# Patient Record
Sex: Female | Born: 1970
Health system: Southern US, Community
[De-identification: ages and names within clinical notes are randomized; demographics above are authoritative.]

## PROBLEM LIST (undated history)

## (undated) DIAGNOSIS — T7840XA Allergy, unspecified, initial encounter: Secondary | ICD-10-CM

## (undated) DIAGNOSIS — K921 Melena: Secondary | ICD-10-CM

## (undated) HISTORY — DX: Allergy, unspecified, initial encounter: T78.40XA

## (undated) HISTORY — DX: Melena: K92.1

---

## 1999-01-17 ENCOUNTER — Other Ambulatory Visit: Admission: RE | Admit: 1999-01-17 | Discharge: 1999-01-17 | Payer: Self-pay | Admitting: Gynecology

## 2000-01-29 ENCOUNTER — Other Ambulatory Visit: Admission: RE | Admit: 2000-01-29 | Discharge: 2000-01-29 | Payer: Self-pay | Admitting: Gynecology

## 2001-01-29 ENCOUNTER — Other Ambulatory Visit: Admission: RE | Admit: 2001-01-29 | Discharge: 2001-01-29 | Payer: Self-pay | Admitting: Obstetrics and Gynecology

## 2002-02-07 ENCOUNTER — Other Ambulatory Visit: Admission: RE | Admit: 2002-02-07 | Discharge: 2002-02-07 | Payer: Self-pay | Admitting: Obstetrics and Gynecology

## 2003-02-20 ENCOUNTER — Other Ambulatory Visit: Admission: RE | Admit: 2003-02-20 | Discharge: 2003-02-20 | Payer: Self-pay | Admitting: Obstetrics and Gynecology

## 2003-06-15 ENCOUNTER — Emergency Department (HOSPITAL_COMMUNITY): Admission: EM | Admit: 2003-06-15 | Discharge: 2003-06-15 | Payer: Self-pay | Admitting: Emergency Medicine

## 2004-01-21 ENCOUNTER — Inpatient Hospital Stay (HOSPITAL_COMMUNITY): Admission: AD | Admit: 2004-01-21 | Discharge: 2004-01-23 | Payer: Self-pay | Admitting: Obstetrics and Gynecology

## 2004-03-04 ENCOUNTER — Other Ambulatory Visit: Admission: RE | Admit: 2004-03-04 | Discharge: 2004-03-04 | Payer: Self-pay | Admitting: Obstetrics and Gynecology

## 2005-03-10 ENCOUNTER — Other Ambulatory Visit: Admission: RE | Admit: 2005-03-10 | Discharge: 2005-03-10 | Payer: Self-pay | Admitting: Obstetrics and Gynecology

## 2005-04-03 ENCOUNTER — Emergency Department (HOSPITAL_COMMUNITY): Admission: EM | Admit: 2005-04-03 | Discharge: 2005-04-04 | Payer: Self-pay | Admitting: Emergency Medicine

## 2005-04-07 ENCOUNTER — Ambulatory Visit: Payer: Self-pay | Admitting: Internal Medicine

## 2005-09-29 ENCOUNTER — Encounter (INDEPENDENT_AMBULATORY_CARE_PROVIDER_SITE_OTHER): Payer: Self-pay | Admitting: *Deleted

## 2005-09-29 ENCOUNTER — Ambulatory Visit (HOSPITAL_COMMUNITY): Admission: RE | Admit: 2005-09-29 | Discharge: 2005-09-29 | Payer: Self-pay | Admitting: Obstetrics and Gynecology

## 2006-01-20 ENCOUNTER — Ambulatory Visit: Payer: Self-pay | Admitting: Internal Medicine

## 2006-02-06 ENCOUNTER — Ambulatory Visit: Payer: Self-pay | Admitting: Internal Medicine

## 2006-10-04 ENCOUNTER — Emergency Department (HOSPITAL_COMMUNITY): Admission: EM | Admit: 2006-10-04 | Discharge: 2006-10-04 | Payer: Self-pay | Admitting: Emergency Medicine

## 2006-10-07 ENCOUNTER — Ambulatory Visit: Payer: Self-pay | Admitting: Internal Medicine

## 2006-11-02 ENCOUNTER — Ambulatory Visit: Payer: Self-pay | Admitting: Internal Medicine

## 2006-11-02 ENCOUNTER — Inpatient Hospital Stay (HOSPITAL_COMMUNITY): Admission: AD | Admit: 2006-11-02 | Discharge: 2006-11-04 | Payer: Self-pay | Admitting: Obstetrics and Gynecology

## 2007-07-13 ENCOUNTER — Encounter: Payer: Self-pay | Admitting: Internal Medicine

## 2008-06-27 ENCOUNTER — Ambulatory Visit: Payer: Self-pay | Admitting: Internal Medicine

## 2008-06-27 DIAGNOSIS — J209 Acute bronchitis, unspecified: Secondary | ICD-10-CM

## 2008-06-28 DIAGNOSIS — F411 Generalized anxiety disorder: Secondary | ICD-10-CM | POA: Insufficient documentation

## 2008-10-11 ENCOUNTER — Emergency Department (HOSPITAL_COMMUNITY): Admission: EM | Admit: 2008-10-11 | Discharge: 2008-10-11 | Payer: Self-pay | Admitting: Family Medicine

## 2008-10-13 ENCOUNTER — Telehealth: Payer: Self-pay | Admitting: Internal Medicine

## 2008-10-13 ENCOUNTER — Encounter: Payer: Self-pay | Admitting: Internal Medicine

## 2008-10-13 ENCOUNTER — Ambulatory Visit: Payer: Self-pay | Admitting: Internal Medicine

## 2008-10-13 DIAGNOSIS — J189 Pneumonia, unspecified organism: Secondary | ICD-10-CM

## 2008-10-13 DIAGNOSIS — R930 Abnormal findings on diagnostic imaging of skull and head, not elsewhere classified: Secondary | ICD-10-CM | POA: Insufficient documentation

## 2008-10-13 LAB — CONVERTED CEMR LAB
BUN: 18 mg/dL (ref 6–23)
Basophils Absolute: 0.1 10*3/uL (ref 0.0–0.1)
Basophils Relative: 0.8 % (ref 0.0–3.0)
CO2: 30 meq/L (ref 19–32)
Calcium: 9.5 mg/dL (ref 8.4–10.5)
Chloride: 106 meq/L (ref 96–112)
Creatinine, Ser: 0.7 mg/dL (ref 0.4–1.2)
Eosinophils Absolute: 0.1 10*3/uL (ref 0.0–0.7)
Eosinophils Relative: 1 % (ref 0.0–5.0)
GFR calc Af Amer: 121 mL/min
GFR calc non Af Amer: 100 mL/min
Glucose, Bld: 88 mg/dL (ref 70–99)
HCT: 38.4 % (ref 36.0–46.0)
Hemoglobin: 13 g/dL (ref 12.0–15.0)
Lymphocytes Relative: 17.7 % (ref 12.0–46.0)
MCHC: 33.9 g/dL (ref 30.0–36.0)
MCV: 92.1 fL (ref 78.0–100.0)
Monocytes Absolute: 1.1 10*3/uL — ABNORMAL HIGH (ref 0.1–1.0)
Monocytes Relative: 14.9 % — ABNORMAL HIGH (ref 3.0–12.0)
Neutro Abs: 4.6 10*3/uL (ref 1.4–7.7)
Neutrophils Relative %: 65.6 % (ref 43.0–77.0)
Platelets: 200 10*3/uL (ref 150–400)
Potassium: 4 meq/L (ref 3.5–5.1)
RBC: 4.17 M/uL (ref 3.87–5.11)
RDW: 12.5 % (ref 11.5–14.6)
Sodium: 142 meq/L (ref 135–145)
WBC: 7.2 10*3/uL (ref 4.5–10.5)
hCG, Beta Chain, Quant, S: <0.5 milliintl units/mL

## 2008-10-16 ENCOUNTER — Telehealth: Payer: Self-pay | Admitting: Internal Medicine

## 2009-03-22 ENCOUNTER — Ambulatory Visit: Payer: Self-pay | Admitting: Internal Medicine

## 2009-11-12 ENCOUNTER — Ambulatory Visit: Payer: Self-pay | Admitting: Internal Medicine

## 2009-11-12 DIAGNOSIS — H1030 Unspecified acute conjunctivitis, unspecified eye: Secondary | ICD-10-CM | POA: Insufficient documentation

## 2010-05-11 ENCOUNTER — Emergency Department (HOSPITAL_COMMUNITY): Admission: EM | Admit: 2010-05-11 | Discharge: 2010-05-11 | Payer: Self-pay | Admitting: Family Medicine

## 2010-08-23 ENCOUNTER — Ambulatory Visit (HOSPITAL_COMMUNITY): Admission: RE | Admit: 2010-08-23 | Discharge: 2010-08-23 | Payer: Self-pay | Admitting: Orthopaedic Surgery

## 2010-09-17 ENCOUNTER — Encounter: Payer: Self-pay | Admitting: Internal Medicine

## 2010-10-29 ENCOUNTER — Ambulatory Visit: Payer: Self-pay | Admitting: Internal Medicine

## 2010-10-29 DIAGNOSIS — D72829 Elevated white blood cell count, unspecified: Secondary | ICD-10-CM | POA: Insufficient documentation

## 2010-10-29 LAB — CONVERTED CEMR LAB
Basophils Absolute: 0 10*3/uL (ref 0.0–0.1)
Basophils Relative: 0.4 % (ref 0.0–3.0)
Eosinophils Absolute: 0.1 10*3/uL (ref 0.0–0.7)
Eosinophils Relative: 0.6 % (ref 0.0–5.0)
HCT: 38.7 % (ref 36.0–46.0)
Hemoglobin: 13.3 g/dL (ref 12.0–15.0)
Lymphocytes Relative: 29.5 % (ref 12.0–46.0)
Lymphs Abs: 3 10*3/uL (ref 0.7–4.0)
MCHC: 34.2 g/dL (ref 30.0–36.0)
MCV: 92.1 fL (ref 78.0–100.0)
Monocytes Absolute: 0.7 10*3/uL (ref 0.1–1.0)
Monocytes Relative: 7 % (ref 3.0–12.0)
Neutro Abs: 6.4 10*3/uL (ref 1.4–7.7)
Neutrophils Relative %: 62.5 % (ref 43.0–77.0)
Platelets: 240 10*3/uL (ref 150.0–400.0)
RBC: 4.21 M/uL (ref 3.87–5.11)
RDW: 12.8 % (ref 11.5–14.6)
WBC: 10.3 10*3/uL (ref 4.5–10.5)

## 2010-12-08 ENCOUNTER — Encounter: Payer: Self-pay | Admitting: Internal Medicine

## 2010-12-19 NOTE — Letter (Signed)
Summary: [facility]   Imported By: Lennie Odor 11/01/2010 11:20:46  _____________________________________________________________________  External Attachment:    Type:   Image     Comment:   External Document

## 2010-12-19 NOTE — Assessment & Plan Note (Signed)
Summary: follow up-abnormal labs-lb   Vital Signs:  Patient profile:   40 year old female Height:      63 inches Weight:      102.50 pounds BMI:     18.22 O2 Sat:      98 % on Room air Temp:     98.3 degrees F oral Pulse rate:   96 / minute BP sitting:   100 / 62  (left arm) Cuff size:   regular  Vitals Entered By: Zella Ball Ewing CMA Duncan Dull) (October 29, 2010 4:12 PM)  O2 Flow:  Room air CC: Followup on adnormal labs/RE   CC:  Followup on adnormal labs/RE.  History of Present Illness: here to f/u recent labs per [facility] yearly exam , with elev WBC wtih incr lymph and granulocytes approx Sep 17 2010; pt relates at the time she has significant cough mild prod with fever, general weakness and malaise that she thought was viral bronchitis and did not seek attention;  has hx of prob bact PNA 2009 .  Symtpoms did last for over a wk and seemed to resolve without further complication;  no further headache, malaise or weakness, sinus symtpoms or ST, cough, and no CP, worsening sob, doe, wheezing, orthopnea, pnd, worsening LE edema, palps, dizziness or syncope .  Pt denies new neuro symptoms such as headache, facial or extremity weakness Pt denies polydipsia, polyuria,  Also states she had recent normal labs per GYN with routine well women's exam approx 2 mo ago as well.  Became concerrned when she read online that elev WBC can mean pre-cancer.   Preventive Screening-Counseling & Management      Drug Use:  no.    Problems Prior to Update: 1)  Leukocytosis  (ICD-288.60) 2)  Conjunctivitis, Acute  (ICD-372.00) 3)  Chest Xray, Abnormal  (ICD-793.1) 4)  Pneumonia, Organism Unspecified  (ICD-486) 5)  Anxiety  (ICD-300.00) 6)  Asthmatic Bronchitis, Acute  (ICD-466.0)  Medications Prior to Update: 1)  Tobramycin Sulfate 0.3 % Soln (Tobramycin Sulfate) .... 2 Gtts Four Times Per Day For 10 Days To Affected Eye 2)  Cephalexin 500 Mg Caps (Cephalexin) .Marland Kitchen.. 1 By Mouth Three Times A  Day  Current Medications (verified): 1)  None  Allergies (verified): No Known Drug Allergies  Past History:  Past Medical History: Last updated: 06/27/2008 Anxiety  Past Surgical History: Last updated: 06/27/2008 hx of D&C  Social History: Last updated: 10/29/2010 Former Smoker work - patient accounting - [employer] Married 2 children Alcohol use-yes - social Husband has had a vasectomy Drug use-no  Risk Factors: Alcohol Use: <1 (10/13/2008) Exercise: yes (10/13/2008)  Risk Factors: Smoking Status: quit (06/27/2008) Passive Smoke Exposure: no (10/13/2008)  Social History: Former Smoker work - [employer] Married 2 children Alcohol use-yes - social Husband has had a vasectomy Drug use-no  Review of Systems       all otherwise negative per pt -    Physical Exam  General:  alert and well-developed.  but small framed Head:  normocephalic and atraumatic.   Eyes:  vision grossly intact, pupils equal, and pupils round.   Ears:  R ear normal and L ear normal.   Nose:  no external deformity and no nasal discharge.   Mouth:  no gingival abnormalities and pharynx pink and moist.   Neck:  supple and no masses.   Lungs:  normal respiratory effort and normal breath sounds.   Heart:  normal rate and regular rhythm.  Extremities:  no edema, no erythema    Impression & Recommendations:  Problem # 1:  LEUKOCYTOSIS (ICD-288.60) most likely related to recent transent ? bacterial illness, exam benign currently, to check repeat cbc , but pt reassured very low likelihood of pre-cancer Orders: TLB-CBC Platelet - w/Differential (85025-CBCD)  Patient Instructions: 1)  Please go to the Lab in the basement for your blood and/or urine tests today  2)  Please call the number on the [facility] Card for results of your testing  3)  Please schedule a follow-up appointment as needed.   Orders Added: 1)  TLB-CBC Platelet - w/Differential [85025-CBCD] 2)   Est. Patient Level III [16109]     Preventive Care Screening  Last Tetanus Booster:    Date:  11/17/2006    Results:  Tdap

## 2011-04-04 NOTE — Discharge Summary (Signed)
Kara [name], Kara [name]               ACCOUNT NO.:  [id number]   MEDICAL RECORD NO.:  [id number]          PATIENT TYPE:  INP   LOCATION:  9117                          FACILITY:  [facility]   PHYSICIAN:  Gerrit Friends. Aldona Bar, M.D.   DATE OF BIRTH:  [birth year]   DATE OF ADMISSION:  11/02/2006  DATE OF DISCHARGE:  11/04/2006                               DISCHARGE SUMMARY   DISCHARGE DIAGNOSES:  1. Term pregnancy delivered 8 pound 4 ounce female infant, Apgars 9 and      9.  2. Blood type A+.   PROCEDURES:  1. Normal spontaneous delivery.  2. Second-degree tear and repair.   SUMMARY:  This 40 year old gravida 3, para 1 presented at term in labor  with ruptured membranes.  She received an epidural on request and had a  fairly rapid progression with a subsequent normal spontaneous delivery  of a viable 8 pound 3 ounce female infant with Apgars of 9 and 9, over a  second-degree tear which was repaired without difficulty.   Her postpartum course was benign.  She had been begun on Zithromax to  treat an upper respiratory infection/otitis media which was continued  postpartum.  Her discharge hemoglobin was 12.8 with a white count of  14,200 and a platelet count of 180,000.  On the morning of November 04, 2006, she was ambulating well, tolerating a regular diet well, bottle  feeding was going without difficulty.  She was comfortable and desirous  of discharge.  Accordingly, she was given a discharge brochure and all  instructions were explained and understood.   DISCHARGE MEDICATIONS:  1. Zithromax which will be continued.  2. She will finish her prenatal vitamins - one a day.  3. She was given a prescription for Motrin 600 mg to use every 6 hours      as needed for cramping and pain.  4. Tylox to use one to two every 4-6 hours as needed for more severe      pain.   She will return to the office follow-up in approximately four weeks'  time or as needed.   CONDITION ON DISCHARGE:  Improved.      Gerrit Friends. Aldona Bar, M.D.  Electronically Signed     RMW/MEDQ  D:  11/04/2006  T:  11/04/2006  Job:  [id number]

## 2011-04-04 NOTE — Op Note (Signed)
[name], Kara [name]               ACCOUNT NO.:  [id number]   MEDICAL RECORD NO.:  [id number]          PATIENT TYPE:  AMB   LOCATION:  SDC                           FACILITY:  [facility]   PHYSICIAN:  Carrington Clamp, M.D. DATE OF BIRTH:  [birth year]   DATE OF PROCEDURE:  09/29/2005  DATE OF DISCHARGE:                                 OPERATIVE REPORT   PREOPERATIVE DIAGNOSIS:  Missed abortion.   POSTOPERATIVE DIAGNOSIS:  Incomplete abortion.   PROCEDURE:  Suction dilation and curettage.   ATTENDING:  Carrington Clamp, M.D.   ANESTHESIA:  General.   SPECIMENS:  Uterine contents.   ESTIMATED BLOOD LOSS:  Minimal.   IV FLUIDS:  1000.   URINE OUTPUT:  Not measured.   COMPLICATIONS:  None.   FINDINGS:  A seven-week size uterus down to six weeks with good cry post  procedure.   MEDICATIONS:  Ancef.   COUNTS:  Correct x3.   TECHNIQUE:  After adequate general LMA anesthesia was achieved, the patient  was prepped and draped in the usual sterile fashion in the dorsal lithotomy  position.  After the bladder was emptied with a red rubber catheter, the  speculum was placed in the vagina, a single-tooth tenaculum was placed on  the cervix and the cervix dilated with Shawnie Pons dilators.  The patient had been  bleeding fairly heavily this morning and the cervix actually was already  significantly dilated enough to consider it an incomplete AB.  In that case,  the patient received a dose of Ancef 1 g to prevent infection.  A 9 mm  curette was passed easily into the uterus and suction curettage was  performed.  Once bleeding was minimal and no more tissue was obtained, sharp  curettage indicated that there was good cry and all products had been  removed.  All instruments were then removed from the uterus and the cervix.  The single-tooth tenaculum site had to be held pressure with a ring forceps  for about 30 seconds to stop bleeding, and then all  instruments were withdrawn from the vagina.   The patient tolerated the  procedure well, was returned to recovery in stable condition.   ADDENDUM:  The patient's blood type is A positive.      Carrington Clamp, M.D.  Electronically Signed     MH/MEDQ  D:  09/29/2005  T:  09/29/2005  Job:  [id number]

## 2011-04-04 NOTE — Assessment & Plan Note (Signed)
[facility]                                 ON-CALL NOTE   NAME:[name], Kara HUFFAKER                      MRN:          [id number]  DATE:03/27/2009                            DOB:          1971/05/30    PHONE NUMBER:  [phone].   Dr. Jonny Ruiz is her regular doctor and Dr. Milinda Antis on call.   The patient complained of foot pain.  She said she saw Dr. Jonny Ruiz on  Thursday for an upper respiratory tract infection, was put on Avelox for  5 days.  She is feeling much better with that.  Since middle of the  weekend, her foot is painful.  She said that it is not red or swollen.  She could walk on it but it is just bothersome now.  It feels somewhat  numb and tingly although she can move all the toes and has no weakness.  She denies any injury and again denies redness or swelling of the foot  and denies fever.  She said she is concerned about the possible side  effects from Avelox.  Her last dose was yesterday, so she is no longer  on it.  I told her to elevate her foot tonight.  She can use some gentle  heat or cold compresses that makes it feel better.  If her symptoms  worsen, then go to the emergency room or call back if she develops  fever, swelling, or other symptoms and otherwise to call the office at  8:30 in the morning to get an appointment to be seen.     Marne A. Tower, MD  Electronically Signed    MAT/MedQ  DD: 03/28/2009  DT: 03/29/2009  Job #: [id number]

## 2011-08-19 LAB — POCT URINALYSIS DIP (DEVICE)
Glucose, UA: NEGATIVE mg/dL
Ketones, ur: 80 mg/dL — AB
Nitrite: NEGATIVE
Protein, ur: 30 mg/dL — AB
Specific Gravity, Urine: 1.025 (ref 1.005–1.030)
Urobilinogen, UA: 0.2 mg/dL (ref 0.0–1.0)

## 2011-08-19 LAB — CBC
MCHC: 34.2 g/dL (ref 30.0–36.0)
MCV: 90.5 fL (ref 78.0–100.0)
Platelets: 199 10*3/uL (ref 150–400)
WBC: 11.2 10*3/uL — ABNORMAL HIGH (ref 4.0–10.5)

## 2011-08-19 LAB — POCT PREGNANCY, URINE: Preg Test, Ur: NEGATIVE

## 2013-10-10 LAB — HM MAMMOGRAPHY

## 2013-10-11 ENCOUNTER — Other Ambulatory Visit: Payer: Self-pay | Admitting: Obstetrics and Gynecology

## 2014-06-08 ENCOUNTER — Telehealth: Payer: Self-pay | Admitting: Internal Medicine

## 2014-06-08 NOTE — Telephone Encounter (Signed)
Called pt back she stated she was dx with bronchitis they had rx zpack for her 7 cough syrup. Advise pt to continue taking meds as rx Dr. Jonny RuizJohn first avail want be until 06/22/14. Made appt @ 10:45 on that day...Raechel Chute/lmb

## 2014-06-08 NOTE — Telephone Encounter (Signed)
Patient was at urgent care yesterday.   She was told to follow up with her primary care.  It looks like she had not seen Dr. Jonny RuizJohn in some years.  Patient is requesting to reestablish with Dr. Jonny RuizJohn so is she can get in for this appointment.

## 2014-06-08 NOTE — Telephone Encounter (Signed)
I [facility]  Best I can say is OK to make OV next available

## 2014-06-22 ENCOUNTER — Ambulatory Visit (INDEPENDENT_AMBULATORY_CARE_PROVIDER_SITE_OTHER): Payer: 59 | Admitting: Internal Medicine

## 2014-06-22 ENCOUNTER — Telehealth: Payer: Self-pay

## 2014-06-22 ENCOUNTER — Other Ambulatory Visit (INDEPENDENT_AMBULATORY_CARE_PROVIDER_SITE_OTHER): Payer: 59

## 2014-06-22 ENCOUNTER — Ambulatory Visit (INDEPENDENT_AMBULATORY_CARE_PROVIDER_SITE_OTHER)
Admission: RE | Admit: 2014-06-22 | Discharge: 2014-06-22 | Disposition: A | Discharge status: Home / Self Care | Payer: 59 | Source: Ambulatory Visit | Attending: Internal Medicine | Admitting: Internal Medicine

## 2014-06-22 ENCOUNTER — Encounter: Payer: Self-pay | Admitting: Internal Medicine

## 2014-06-22 VITALS — BP 94/60 | HR 98 | Temp 98.3°F | Ht 63.0 in | Wt 100.5 lb

## 2014-06-22 DIAGNOSIS — J189 Pneumonia, unspecified organism: Secondary | ICD-10-CM

## 2014-06-22 DIAGNOSIS — R911 Solitary pulmonary nodule: Secondary | ICD-10-CM

## 2014-06-22 DIAGNOSIS — Z0000 Encounter for general adult medical examination without abnormal findings: Secondary | ICD-10-CM

## 2014-06-22 LAB — CBC WITH DIFFERENTIAL/PLATELET
Basophils Absolute: 0 10*3/uL (ref 0.0–0.1)
Basophils Relative: 0.3 % (ref 0.0–3.0)
Eosinophils Absolute: 0 10*3/uL (ref 0.0–0.7)
Eosinophils Relative: 0.4 % (ref 0.0–5.0)
HCT: 41.5 % (ref 36.0–46.0)
Hemoglobin: 13.7 g/dL (ref 12.0–15.0)
Lymphocytes Relative: 19.5 % (ref 12.0–46.0)
Lymphs Abs: 2 10*3/uL (ref 0.7–4.0)
MCHC: 33 g/dL (ref 30.0–36.0)
MCV: 90.8 fl (ref 78.0–100.0)
Monocytes Absolute: 0.8 10*3/uL (ref 0.1–1.0)
Monocytes Relative: 7.7 % (ref 3.0–12.0)
Neutro Abs: 7.4 10*3/uL (ref 1.4–7.7)
Neutrophils Relative %: 72.1 % (ref 43.0–77.0)
Platelets: 359 10*3/uL (ref 150.0–400.0)
RBC: 4.57 Mil/uL (ref 3.87–5.11)
RDW: 14.1 % (ref 11.5–15.5)
WBC: 10.2 10*3/uL (ref 4.0–10.5)

## 2014-06-22 LAB — URINALYSIS, ROUTINE W REFLEX MICROSCOPIC
Bilirubin Urine: NEGATIVE
Ketones, ur: NEGATIVE
Leukocytes, UA: NEGATIVE
Nitrite: NEGATIVE
Specific Gravity, Urine: <=1.005 — AB (ref 1.000–1.030)
Total Protein, Urine: NEGATIVE
Urine Glucose: NEGATIVE
Urobilinogen, UA: 0.2 (ref 0.0–1.0)
WBC, UA: NONE SEEN (ref 0–?)
pH: 5.5 (ref 5.0–8.0)

## 2014-06-22 LAB — HEPATIC FUNCTION PANEL
ALT: 17 U/L (ref 0–35)
AST: 19 U/L (ref 0–37)
Albumin: 4.6 g/dL (ref 3.5–5.2)
Alkaline Phosphatase: 45 U/L (ref 39–117)
Bilirubin, Direct: 0.1 mg/dL (ref 0.0–0.3)
Total Bilirubin: 0.4 mg/dL (ref 0.2–1.2)
Total Protein: 7.8 g/dL (ref 6.0–8.3)

## 2014-06-22 LAB — BASIC METABOLIC PANEL
BUN: 15 mg/dL (ref 6–23)
CO2: 24 mEq/L (ref 19–32)
Calcium: 9.4 mg/dL (ref 8.4–10.5)
Chloride: 104 mEq/L (ref 96–112)
Creatinine, Ser: 0.8 mg/dL (ref 0.4–1.2)
GFR: 86.79 mL/min (ref >60.00)
Glucose, Bld: 93 mg/dL (ref 70–99)
Potassium: 3.8 mEq/L (ref 3.5–5.1)
Sodium: 137 mEq/L (ref 135–145)

## 2014-06-22 LAB — TSH: TSH: 1.54 u[IU]/mL (ref 0.35–4.50)

## 2014-06-22 LAB — LIPID PANEL
Cholesterol: 156 mg/dL (ref 0–200)
HDL: 54.6 mg/dL (ref >39.00)
LDL Cholesterol: 87 mg/dL (ref 0–99)
NonHDL: 101.4
Total CHOL/HDL Ratio: 3
Triglycerides: 73 mg/dL (ref 0.0–149.0)
VLDL: 14.6 mg/dL (ref 0.0–40.0)

## 2014-06-22 NOTE — Telephone Encounter (Signed)
Chest xray ordered

## 2014-06-22 NOTE — Progress Notes (Signed)
Subjective:    Patient ID: Kara [name], female    DOB: 08/22/1971, 43 y.o.   MRN: [id number]  HPI  Here for wellness and re-establish and f/u ? Lung nodule;  Overall doing ok;  Pt denies CP, worsening SOB, DOE, wheezing, orthopnea, PND, worsening LE edema, palpitations, dizziness or syncope.  Pt denies neurological change such as new headache, facial or extremity weakness.  Pt denies polydipsia, polyuria, or low sugar symptoms. Pt states overall good compliance with treatment and medications, good tolerability, and has been trying to follow lower cholesterol diet.  Pt denies worsening depressive symptoms, suicidal ideation or panic. No fever, night sweats, wt loss, loss of appetite, or other constitutional symptoms.  Pt states good ability with ADL's, has low fall risk, home safety reviewed and adequate, no other significant changes in hearing or vision  [facility]en at [facility] 2 wks ago, with fever, HA and small cough,tx for bronchitis  - zpack; cxr showed lung nodule left lung; has previous cxr 2010 neg.  CXR nov 25/209 with right mid lung nodule.  Prior hx smoking 1ppd for10 yrs, but none for 15 yrs.  Most recent cxr per her understanding showed nodule left lung, but then recalls provider mentioned it appeared no change from previous.  No record avail today Past Medical History  Diagnosis Date  . Allergy   . Blood in stool    No past surgical history on file.  reports that she has quit smoking. She does not have any smokeless tobacco history on file. She reports that she drinks alcohol. She reports that she does not use illicit drugs. family history includes Hypertension in her father and mother; Mental illness in her other. Allergies  Allergen Reactions  . Avelox [Moxifloxacin Hcl In Nacl]     Caused skin to tingle   No current outpatient prescriptions on file prior to visit.   No current facility-administered medications on file prior to visit.   Review of Systems Constitutional: Negative for  increased diaphoresis, other activity, appetite or other siginficant weight change  HENT: Negative for worsening hearing loss, ear pain, facial swelling, mouth sores and neck stiffness.   Eyes: Negative for other worsening pain, redness or visual disturbance.  Respiratory: Negative for shortness of breath and wheezing.   Cardiovascular: Negative for chest pain and palpitations.  Gastrointestinal: Negative for diarrhea, blood in stool, abdominal distention or other pain Genitourinary: Negative for hematuria, flank pain or change in urine volume.  Musculoskeletal: Negative for myalgias or other joint complaints.  Skin: Negative for color change and wound.  Neurological: Negative for syncope and numbness. other than noted Hematological: Negative for adenopathy. or other swelling Psychiatric/Behavioral: Negative for hallucinations, self-injury, decreased concentration or other worsening agitation.      Objective:   Physical Exam BP 94/60  Pulse 98  Temp(Src) 98.3 F (36.8 C) (Oral)  Ht 5\' 3"  (1.6 m)  Wt 100 lb 8 oz (45.587 kg)  BMI 17.81 kg/m2  SpO2 95% VS noted,  Constitutional: Pt is oriented to person, place, and time. Appears well-developed and well-nourished.  Head: Normocephalic and atraumatic.  Right Ear: External ear normal.  Left Ear: External ear normal.  Nose: Nose normal.  Mouth/Throat: Oropharynx is clear and moist.  Eyes: Conjunctivae and EOM are normal. Pupils are equal, round, and reactive to light.  Neck: Normal range of motion. Neck supple. No JVD present. No tracheal deviation present.  Cardiovascular: Normal rate, regular rhythm, normal heart sounds and intact distal pulses.   Pulmonary/Chest:  Effort normal and breath sounds without rales or wheezing  Abdominal: Soft. Bowel sounds are normal. NT. No HSM  Musculoskeletal: Normal range of motion. Exhibits no edema.  Lymphadenopathy:  Has no cervical adenopathy.  Neurological: Pt is alert and oriented to person,  place, and time. Pt has normal reflexes. No cranial nerve deficit. Motor grossly intact Skin: Skin is warm and dry. No rash noted.  Psychiatric:  Has normal mood and affect. Behavior is normal. except 2+ nervous    Assessment & Plan:

## 2014-06-22 NOTE — Assessment & Plan Note (Signed)
With some ? Of left vs right nodule, and hx of smoking, for cxr f/u today, ? Related to recent bronchitis vs other; pt declines CT for now, will ask for f/u cxr today

## 2014-06-22 NOTE — Patient Instructions (Signed)
Please continue all other medications as before, and refills have been done if requested.  Please have the pharmacy call with any other refills you may need.  Please continue your efforts at being more active, low cholesterol diet, and weight control.  You are otherwise up to date with prevention measures today.  Please keep your appointments with your specialists as you may have planned  Please go to the XRAY Department in the Basement (go straight as you get off the elevator) for the x-ray testing  Please go to the LAB in the Basement (turn left off the elevator) for the tests to be done today  You will be contacted by phone if any changes need to be made immediately.  Otherwise, you will receive a letter about your results with an explanation, but please check with MyChart first.  Please remember to sign up for MyChart if you have not done so, as this will be important to you in the future with finding out test results, communicating by private email, and scheduling acute appointments online when needed.  Please return in 1 year for your yearly visit, or sooner if needed 

## 2014-06-22 NOTE — Progress Notes (Signed)
Pre visit review using our clinic review tool, if applicable. No additional management support is needed unless otherwise documented below in the visit note. 

## 2014-06-24 ENCOUNTER — Encounter: Payer: Self-pay | Admitting: Internal Medicine

## 2014-06-24 NOTE — Assessment & Plan Note (Signed)

## 2014-06-28 ENCOUNTER — Ambulatory Visit (INDEPENDENT_AMBULATORY_CARE_PROVIDER_SITE_OTHER): Payer: 59 | Admitting: Internal Medicine

## 2014-06-28 ENCOUNTER — Ambulatory Visit (INDEPENDENT_AMBULATORY_CARE_PROVIDER_SITE_OTHER)
Admission: RE | Admit: 2014-06-28 | Discharge: 2014-06-28 | Disposition: A | Discharge status: Home / Self Care | Payer: 59 | Source: Ambulatory Visit | Attending: Internal Medicine | Admitting: Internal Medicine

## 2014-06-28 ENCOUNTER — Encounter: Payer: Self-pay | Admitting: Internal Medicine

## 2014-06-28 VITALS — BP 102/60 | HR 95 | Temp 98.1°F | Wt 101.0 lb

## 2014-06-28 DIAGNOSIS — J189 Pneumonia, unspecified organism: Secondary | ICD-10-CM

## 2014-06-28 DIAGNOSIS — R911 Solitary pulmonary nodule: Secondary | ICD-10-CM

## 2014-06-28 DIAGNOSIS — H9209 Otalgia, unspecified ear: Secondary | ICD-10-CM

## 2014-06-28 DIAGNOSIS — H9202 Otalgia, left ear: Secondary | ICD-10-CM

## 2014-06-28 NOTE — Progress Notes (Signed)
   Subjective:    Patient ID: Kara [name], female    DOB: 07/29/1971, 43 y.o.   MRN: [id number]  HPI  Here to f/u, c/o pain left ear yesterday transient but no hearing loss, vertigo, HA, d/c.  Pt denies further fever, cough, chest pain, increased sob or doe, wheezing, orthopnea, PND, increased LE swelling, palpitations, dizziness or syncope.  Pt denies fever, wt loss, night sweats, loss of appetite, or other constitutional symptoms. Pt also concerned about ? Of pulm nodule that might require CT.  Past Medical History  Diagnosis Date  . Allergy   . Blood in stool    No past surgical history on file.  reports that she has quit smoking. She does not have any smokeless tobacco history on file. She reports that she drinks alcohol. She reports that she does not use illicit drugs. family history includes Hypertension in her father and mother; Mental illness in her other. Allergies  Allergen Reactions  . Avelox [Moxifloxacin Hcl In Nacl]     Caused skin to tingle   No current outpatient prescriptions on file prior to visit.   No current facility-administered medications on file prior to visit.   Review of Systems  Constitutional: Negative for unusual diaphoresis or other sweats  HENT: Negative for ringing in ear Eyes: Negative for double vision or worsening visual disturbance.  Respiratory: Negative for choking and stridor.   Gastrointestinal: Negative for vomiting or other signifcant bowel change Genitourinary: Negative for hematuria or decreased urine volume.  Musculoskeletal: Negative for other MSK pain or swelling Skin: Negative for color change and worsening wound.  Neurological: Negative for tremors and numbness other than noted  Psychiatric/Behavioral: Negative for decreased concentration or agitation other than above       Objective:   Physical Exam BP 102/60  Pulse 95  Temp(Src) 98.1 F (36.7 C) (Oral)  Wt 101 lb (45.813 kg)  SpO2 96%  LMP 06/18/2014 VS noted,    Constitutional: Pt appears well-developed, well-nourished.  HENT: Head: NCAT.  Right Ear: External ear normal.  Left Ear: External ear normal.  Eyes: . Pupils are equal, round, and reactive to light. Conjunctivae and EOM are normal Neck: Normal range of motion. Neck supple.  Cardiovascular: Normal rate and regular rhythm.   Pulmonary/Chest: Effort normal and breath sounds normal.  Neurological: Pt is alert. Not confused , motor grossly intact Skin: Skin is warm. No rash Psychiatric: Pt behavior is normal. No agitation.     Assessment & Plan:

## 2014-06-28 NOTE — Progress Notes (Signed)
Pre visit review using our clinic review tool, if applicable. No additional management support is needed unless otherwise documented below in the visit note. 

## 2014-07-02 DIAGNOSIS — H9202 Otalgia, left ear: Secondary | ICD-10-CM | POA: Insufficient documentation

## 2014-07-02 NOTE — Assessment & Plan Note (Signed)
Exam benign, hold further specific eval and tx,  to f/u any worsening symptoms or concerns

## 2014-07-02 NOTE — Assessment & Plan Note (Signed)
Resolved clinically, for f/u cxr as above, hold further antibiotic for now

## 2014-07-02 NOTE — Patient Instructions (Signed)
Please continue all other medications as before, and refills have been done if requested.  Please have the pharmacy call with any other refills you may need.  Please continue your efforts at being more active, low cholesterol diet, and weight control.  Please keep your appointments with your specialists as you may have planned  Please go to the XRAY Department in the Basement (go straight as you get off the elevator) for the x-ray testing  You will be contacted by phone if any changes need to be made immediately.  Otherwise, you will receive a letter about your results with an explanation, but please check with MyChart first.  Please remember to sign up for MyChart if you have not done so, as this will be important to you in the future with finding out test results, communicating by private email, and scheduling acute appointments online when needed.  

## 2014-07-02 NOTE — Assessment & Plan Note (Signed)
For f/u cxr, suscept related to recent infectious process, for CT if persistent by cxr

## 2014-12-13 ENCOUNTER — Other Ambulatory Visit: Payer: Self-pay | Admitting: Obstetrics and Gynecology

## 2014-12-14 LAB — CYTOLOGY - PAP

## 2016-02-14 ENCOUNTER — Other Ambulatory Visit: Payer: Self-pay | Admitting: Obstetrics and Gynecology

## 2016-02-14 DIAGNOSIS — Z01419 Encounter for gynecological examination (general) (routine) without abnormal findings: Secondary | ICD-10-CM | POA: Diagnosis not present

## 2016-02-14 DIAGNOSIS — Z681 Body mass index (BMI) 19 or less, adult: Secondary | ICD-10-CM | POA: Diagnosis not present

## 2016-02-14 DIAGNOSIS — Z124 Encounter for screening for malignant neoplasm of cervix: Secondary | ICD-10-CM | POA: Diagnosis not present

## 2016-02-14 DIAGNOSIS — Z1231 Encounter for screening mammogram for malignant neoplasm of breast: Secondary | ICD-10-CM | POA: Diagnosis not present

## 2016-02-15 LAB — CYTOLOGY - PAP

## 2016-03-08 DIAGNOSIS — J020 Streptococcal pharyngitis: Secondary | ICD-10-CM | POA: Diagnosis not present

## 2016-03-08 DIAGNOSIS — J029 Acute pharyngitis, unspecified: Secondary | ICD-10-CM | POA: Diagnosis not present

## 2016-08-21 DIAGNOSIS — H5213 Myopia, bilateral: Secondary | ICD-10-CM | POA: Diagnosis not present

## 2016-08-28 DIAGNOSIS — L03032 Cellulitis of left toe: Secondary | ICD-10-CM | POA: Diagnosis not present

## 2016-09-24 ENCOUNTER — Encounter: Payer: Self-pay | Admitting: Podiatry

## 2016-09-24 ENCOUNTER — Ambulatory Visit (INDEPENDENT_AMBULATORY_CARE_PROVIDER_SITE_OTHER): Payer: 59 | Admitting: Podiatry

## 2016-09-24 DIAGNOSIS — L923 Foreign body granuloma of the skin and subcutaneous tissue: Secondary | ICD-10-CM

## 2016-09-24 DIAGNOSIS — L600 Ingrowing nail: Secondary | ICD-10-CM | POA: Diagnosis not present

## 2016-09-24 DIAGNOSIS — Z23 Encounter for immunization: Secondary | ICD-10-CM | POA: Diagnosis not present

## 2016-09-24 NOTE — Patient Instructions (Addendum)
   Today I removed any hair that was lodged at the bottom of the left big toenail. Complete any of your remaining antibiotics as prescribed Use over-the-counter pain medication as needed such as acetaminophen or ibuprofen  Dishwashing SOAP OR  Epsom SALT INSTRUCTIONS  THE DAY AFTER PROCEDURE  Please follow the instructions your doctor has marked.   Shower as usual. Before getting out, place a drop of dishwashing liquid soap (Dial) on a wet, clean washcloth.  Gently wipe washcloth over affected area.  Afterward, rinse the area with warm water.  Blot the area dry with a soft cloth and cover with antibiotic ointment (neosporin, polysporin, bacitracin) and band aid or gauze and tape Place 3-4 drops of dishwashing soap in a quart of warm tap water.  Submerge foot into water for 2 minutes.  If bandage was applied after your procedure, leave on to allow for easy lift off, then remove and continue with soak for the remaining time.  Next, blot area dry with a soft cloth and cover with a bandage.  Apply other medications as directed by your doctor, such as triple antibiotic ointment or neosporin antibiotic ointment.

## 2016-09-24 NOTE — Progress Notes (Signed)
   Subjective:    Patient ID: Kara [name], female    DOB: 08/02/1971, 45 y.o.   MRN: [id number]  HPI     This patient presents today complaining of approximately one month history of pain around the left great toenail area which she says has been diagnosed as an ingrowing toenail. She describes the discomfort in around the left hallux toenail as burning, throbbing, numbness, soreness with some drainage and has some nighttime pain. Patient says that she's been to a dermatologist who prescribed doxycycline which she's been taking twice a day for 1 month without a reduction of the symptoms. She went to another dermatologist in the past 1-2 days who referred the patient to our office     Review of Systems  All other systems reviewed and are negative.      Objective:   Physical Exam  Patient appears orientated 3  Vascular: No calf edema or calf tenderness bilaterally DP and PT pulses 2/4 bilaterally Capillary reflex immediate bilaterally  Neurological: Sensation to 10 g monofilament wire intact 5/5 bilaterally Vibratory sensation reactive bilaterally Ankle reflex equal and reactive bilaterally  Dermatological: The posterior lateral nail fold area appears to have a fine hair and is on the dorsal aspect of the nail plate with adjacent granulation tissue. There is a low-grade erythema in the distal hallux and around the nail area. There is some slights serous drainage from the posterior nail fold area without any malodor. The dorsal left hallux nails tender to direct palpation. The medial margin of the left hallux toenail is mildly incurvated and slightly tender  Musculoskeletal: Manual motor testing: Dorsi flexion, plantar flexion, inversion, eversion 5/5 bilaterally Hammertoes 2-3 bilaterally      Assessment & Plan:   Assessment: Paronychia left hallux Suspect foreign body/hair posterior nail fold left hallux  Plan: I informed patient that I saw visual evidence of a  possible foreign body (hair) and recommended that we remove the foreign body under local to see if this resolved the symptoms. Patient verbally consents  Left hallux is blocked with 3 mL 50-50 mixture of 2% plain Xylocaine and 0.5% plain Sensorcaine. Toe is prepped with Betadine and exsanguinated. The posterior nail fold was probed and a 5 mm hair was removed. There is no active drainage or pus production after removal of the hair. There is some granulation tissue in the posterior nail fold area. An antibiotic compression dressing was applied. The tourniquet was released and spontaneous capillary filling time noted in the left hallux. Patient tolerated procedure without any difficulty.  Postoperative soaks suggesting dishwashing soap or Epsom salts and topical application of antibiotic ointment starting in 24 hours. Complete any remaining doxycycline. Recommended over-the-counter pain  meds for pain control. Will have patient back in the next 7 days to see if the symptoms are resolving with removal of the foreign body/hair. Will consider removing the margin of the hallux nail or more the nail if the symptoms persist. Also will consider x-ray examination if the symptoms persist.  Reappoint 7 days

## 2016-10-01 ENCOUNTER — Ambulatory Visit (INDEPENDENT_AMBULATORY_CARE_PROVIDER_SITE_OTHER): Payer: 59 | Admitting: Podiatry

## 2016-10-01 ENCOUNTER — Encounter: Payer: Self-pay | Admitting: Podiatry

## 2016-10-01 VITALS — BP 108/75 | HR 91 | Resp 18

## 2016-10-01 DIAGNOSIS — L923 Foreign body granuloma of the skin and subcutaneous tissue: Secondary | ICD-10-CM

## 2016-10-01 NOTE — Patient Instructions (Signed)
The infection has resolved after removal of the hair lodged in the base of the left great toenail. No further treatment is indicated

## 2016-10-02 NOTE — Progress Notes (Signed)
Patient ID: Kara CooksMichelle L [name], female   DOB: 10/02/1971, 45 y.o.   MRN: [id number]   Subjective: This patient presents for follow-up from the visit of 09/24/2016. At that time a foreign body/hair was removed from the posterior nail fold area the left hallux. Patient states that her toe is comfortable now and she denies any pain in the area at this time. She was instructed to complete any remaining antibiotics that she had on the visit of 09/24/2016, however, patient admits that she really did not take any further antibiotics  Objective: Orientated 3 DP and PT pulses 2/4 bilaterally No peripheral edema or calf tenderness bilaterally The left hallux nail posterior nail demonstrates no erythema, edema, warmth, drainage The left hallux is not tender to direct pressure  Assessment: Resolved paronychia left hallux associated with foreign body  Plan: Informed patient that the infection has resolved and no further treatment is indicated. Patient is discharged with instruction to contact she has any future concerns

## 2016-11-23 DIAGNOSIS — J019 Acute sinusitis, unspecified: Secondary | ICD-10-CM | POA: Diagnosis not present

## 2016-12-11 ENCOUNTER — Ambulatory Visit (INDEPENDENT_AMBULATORY_CARE_PROVIDER_SITE_OTHER): Payer: 59 | Admitting: Podiatry

## 2016-12-11 VITALS — BP 108/75 | HR 83 | Resp 14

## 2016-12-11 DIAGNOSIS — M79675 Pain in left toe(s): Secondary | ICD-10-CM

## 2016-12-11 DIAGNOSIS — B351 Tinea unguium: Secondary | ICD-10-CM | POA: Diagnosis not present

## 2016-12-11 NOTE — Progress Notes (Signed)
This patient presents the office concerned about the big toenail on her left foot. She states that she had an infection. 3 months ago that was treated by Dr. Leeanne Deeduchman. He says that that area on the left big toe has healed and are causing any pain or discomfort. He says now that she has a painful area on the tip of the great toe left foot is occasionally painful walking and wearing her shoes. She denies any history of trauma or injury to the foot. No drainage is noted facility for an evaluation and treatment of this condition   GENERAL APPEARANCE: Alert, conversant. Appropriately groomed. No acute distress.  VASCULAR: Pedal pulses are  [facility].  Capillary refill time is immediate to all digits,  Normal temperature gradient.  Digital hair growth is present bilateral  NEUROLOGIC: sensation is normal to 5.07 monofilament at 5/5 sites bilateral.  Light touch is intact bilateral, Muscle strength normal.  MUSCULOSKELETAL: acceptable muscle strength, tone and stability bilateral.  Intrinsic muscluature intact bilateral.  Rectus appearance of foot and digits noted bilateral.   DERMATOLOGIC: skin color, texture, and turgor are within normal limits.  No preulcerative lesions or ulcers  are seen, no interdigital maceration noted.  No open lesions present.   No drainage noted.  Nails  Distal aspect left hallux is incurvated and brownish in color.  No swelling or drainage noted.  There is a red inflamed area at the distal nail border left great toe.    Onychomycosis  Vs. Possible bone spur.  ROV  told this patient that I do not believe there is any evidence of fungal elements to her nail. There is a red, inflamed area, which could be due to a bone spur off the distal aspect of the hallux. We need to watch this area to see if it enlarges over time. In the meantime, I did suggest that we will provide her with formula 3 for application to the nail to keep the nail from worsening. She is return  to the office when necessary  Helane GuntherGregory Ciani Rutten DPM

## 2017-01-04 DIAGNOSIS — W19XXXA Unspecified fall, initial encounter: Secondary | ICD-10-CM | POA: Diagnosis not present

## 2017-01-04 DIAGNOSIS — S5002XA Contusion of left elbow, initial encounter: Secondary | ICD-10-CM | POA: Diagnosis not present

## 2017-01-04 DIAGNOSIS — S42402A Unspecified fracture of lower end of left humerus, initial encounter for closed fracture: Secondary | ICD-10-CM | POA: Diagnosis not present

## 2017-01-05 ENCOUNTER — Encounter (INDEPENDENT_AMBULATORY_CARE_PROVIDER_SITE_OTHER): Payer: Self-pay | Admitting: Orthopaedic Surgery

## 2017-01-05 ENCOUNTER — Ambulatory Visit (INDEPENDENT_AMBULATORY_CARE_PROVIDER_SITE_OTHER): Payer: 59 | Admitting: Orthopaedic Surgery

## 2017-01-05 DIAGNOSIS — S52045A Nondisplaced fracture of coronoid process of left ulna, initial encounter for closed fracture: Secondary | ICD-10-CM | POA: Diagnosis not present

## 2017-01-05 NOTE — Progress Notes (Signed)
   Office Visit Note   Patient: Kara [name]           Date of Birth: 12/22/1970           MRN: [id number] Visit Date: 01/05/2017              Requested by: Corwin LevinsJames W John, MD [address] [place] [place], [place] [address] PCP: Oliver BarreJames John, MD   Assessment & Plan: Visit Diagnoses:  1. Closed nondisplaced fracture of coronoid process of left ulna, initial encounter     Plan: left coronoid fracture. CT elbow to better characterize fracture.  Sling at all times.  NWB.  F/u 1 week  Follow-Up Instructions: Return in about 1 week (around 01/12/2017).   Orders:  No orders of the defined types were placed in this encounter.  No orders of the defined types were placed in this encounter.     Procedures: No procedures performed   Clinical Data: No additional findings.   Subjective: Chief Complaint  Patient presents with  . Left Elbow - Pain, Fracture    46 yo female RHD had fall yesterday directly onto left elbow.  Went to urgent care and xrays showed nondisplaced comminuted left coronoid fracture.  Placed in sling and given f/u appt today.  Pain is better with advil and sling.    Review of Systems  Constitutional: Negative.   HENT: Negative.   Eyes: Negative.   Respiratory: Negative.   Cardiovascular: Negative.   Endocrine: Negative.   Musculoskeletal: Negative.   Neurological: Negative.   Hematological: Negative.   Psychiatric/Behavioral: Negative.   All other systems reviewed and are negative.    Objective: Vital Signs: There were no vitals taken for this visit.  Physical Exam  Constitutional: She is oriented to person, place, and time. She appears well-developed and well-nourished.  HENT:  Head: Normocephalic and atraumatic.  Eyes: EOM are normal.  Neck: Neck supple.  Pulmonary/Chest: Effort normal.  Abdominal: Soft.  Neurological: She is alert and oriented to person, place, and time.  Skin: Skin is warm. Capillary refill takes less than 2 seconds.    Psychiatric: She has a normal mood and affect. Her behavior is normal. Judgment and thought content normal.  Nursing note and vitals reviewed.   Ortho Exam Left elbow - painful ROM of elbow - NVI - elbow ttp  Specialty Comments:  No specialty comments available.  Imaging: No results found.   PMFS History: Patient Active Problem List   Diagnosis Date Noted  . Closed nondisplaced fracture of coronoid process of left ulna 01/05/2017  . Left ear pain 07/02/2014  . Pulmonary nodule 06/22/2014  . Pneumonia, organism unspecified(486) 10/13/2008  . ANXIETY 06/28/2008   Past Medical History:  Diagnosis Date  . Allergy   . Blood in stool     Family History  Problem Relation Age of Onset  . Hypertension Mother   . Hypertension Father   . Mental illness Other     No past surgical history on file. Social History   Occupational History  . PT ACCOUNT RE[employer]   Social History Main Topics  . Smoking status: Former Smoker    Quit date: 01/05/2017  . Smokeless tobacco: Never Used     Comment: pt states she does not smoke. (01/05/17)  . Alcohol use Yes  . Drug use: No  . Sexual activity: Not on file

## 2017-01-06 ENCOUNTER — Ambulatory Visit
Admission: RE | Admit: 2017-01-06 | Discharge: 2017-01-06 | Disposition: A | Discharge status: Home / Self Care | Payer: 59 | Source: Ambulatory Visit | Attending: Orthopaedic Surgery | Admitting: Orthopaedic Surgery

## 2017-01-06 DIAGNOSIS — S52042A Displaced fracture of coronoid process of left ulna, initial encounter for closed fracture: Secondary | ICD-10-CM | POA: Diagnosis not present

## 2017-01-06 DIAGNOSIS — S52045A Nondisplaced fracture of coronoid process of left ulna, initial encounter for closed fracture: Secondary | ICD-10-CM

## 2017-01-06 NOTE — Progress Notes (Signed)
Please let her know that the CT scan shows that the fracture should heal fine without needing surgery

## 2017-01-13 ENCOUNTER — Encounter (INDEPENDENT_AMBULATORY_CARE_PROVIDER_SITE_OTHER): Payer: Self-pay | Admitting: Orthopaedic Surgery

## 2017-01-13 ENCOUNTER — Ambulatory Visit (INDEPENDENT_AMBULATORY_CARE_PROVIDER_SITE_OTHER): Payer: 59 | Admitting: Orthopaedic Surgery

## 2017-01-13 DIAGNOSIS — S52045A Nondisplaced fracture of coronoid process of left ulna, initial encounter for closed fracture: Secondary | ICD-10-CM

## 2017-01-13 NOTE — Progress Notes (Signed)
   Office Visit Note   Patient: Kara [name]           Date of Birth: 02/21/1971           MRN: [id number] Visit Date: 01/13/2017              Requested by: Corwin LevinsJames W John, MD [address] [place] [place] [address] PCP: Oliver BarreJames John, MD   Assessment & Plan: Visit Diagnoses:  1. Closed nondisplaced fracture of coronoid process of left ulna, initial encounter     Plan: CT scan of the elbow shows a comminuted minimally displaced coronoid fracture. The ulnar humeral joint is congruent without any evidence of instability. We will continue to treat this nonoperatively. I will like her to begin occupational therapy on 01/19/2017. I'll see her back in 3 weeks for recheck.2 view x-rays of the left elbow on return.  Follow-Up Instructions: Return in about 3 weeks (around 02/03/2017).   Orders:  Orders Placed This Encounter  Procedures  . Ambulatory referral to Physical Therapy   No orders of the defined types were placed in this encounter.     Procedures: No procedures performed   Clinical Data: No additional findings.   Subjective: Chief Complaint  Patient presents with  . Left Elbow - Follow-up, Pain    Kara [name] follows up today to review her CT scan of her elbow. She is feeling better.    Review of Systems   Objective: Vital Signs: There were no vitals taken for this visit.  Physical Exam  Ortho Exam Exam of her left elbow is significantly better. She has much better supination and pronation and elbow range of motion. She has slight bruising and swelling. Specialty Comments:  No specialty comments available.  Imaging: No results found.   PMFS History: Patient Active Problem List   Diagnosis Date Noted  . Closed nondisplaced fracture of coronoid process of left ulna 01/05/2017  . Left ear pain 07/02/2014  . Pulmonary nodule 06/22/2014  . Pneumonia, organism unspecified(486) 10/13/2008  . ANXIETY 06/28/2008   Past Medical History:  Diagnosis Date    . Allergy   . Blood in stool     Family History  Problem Relation Age of Onset  . Hypertension Mother   . Hypertension Father   . Mental illness Other     No past surgical history on file. Social History   Occupational History  . 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 Smoking status: Former Smoker    Quit date: 01/05/2017  . Smokeless tobacco: Never Used     Comment: pt states she does not smoke. (01/05/17)  . Alcohol use Yes  . Drug use: No  . Sexual activity: Not on file

## 2017-01-21 ENCOUNTER — Encounter: Payer: Self-pay | Admitting: Physical Therapy

## 2017-01-21 ENCOUNTER — Ambulatory Visit: Payer: 59 | Attending: Orthopaedic Surgery | Admitting: Physical Therapy

## 2017-01-21 DIAGNOSIS — M6281 Muscle weakness (generalized): Secondary | ICD-10-CM | POA: Diagnosis not present

## 2017-01-21 DIAGNOSIS — M25622 Stiffness of left elbow, not elsewhere classified: Secondary | ICD-10-CM | POA: Diagnosis not present

## 2017-01-21 DIAGNOSIS — M25522 Pain in left elbow: Secondary | ICD-10-CM | POA: Insufficient documentation

## 2017-01-21 NOTE — Patient Instructions (Signed)
AROM: Wrist Flexion / Extension    Actively bend right wrist forward then back as far as possible. Use the right hand to assist the movement Repeat __10__ times per set. Do __2__ sets per session. Do __2__ sessions per day.  Copyright  VHI. All rights reserved.  AROM: Elbow Flexion / Extension    With left hand palm up, gently bend elbow as far as possible. Then straighten arm as far as possible. Hold 5 sec. Use right hand to assist movement Repeat __10__ times per set. Do _2__ sets per session. Do __2__ sessions per day.  Copyright  VHI. All rights reserved.    Place arm on table and slide arm forward to stretch the shoulder - 10x holding for 5 sec do 2x/day

## 2017-01-21 NOTE — Therapy (Signed)
[facility] [address], [address] [place], [place], [address] Phone: [phone]   Fax:  [phone]  Physical Therapy Evaluation  Patient Details  Name: Kara [name] MRN: [id number] Date of Birth: 01/02/1971 Referring Provider: Tarry Kos, MD  Encounter Date: 01/21/2017      PT End of Session - 01/21/17 1628    Visit Number 1   Date for PT Re-Evaluation 03/18/17   PT Start Time 1540   PT Stop Time 1620   PT Time Calculation (min) 40 min   Activity Tolerance Patient tolerated treatment well   Behavior During Therapy [facility] for tasks assessed/performed      Past Medical History:  Diagnosis Date  . Allergy   . Blood in stool     History reviewed. No pertinent surgical history.  There were no vitals filed for this visit.       Subjective Assessment - 01/21/17 1544    Subjective Patient presents with left coronoid fracture that is nondisplaced.  See precautions below.  States fell down the stairs when carrying phone and slipped.  7/10 at night when achy   Pertinent History NWB, AAROM, PROM   Limitations Lifting   Patient Stated Goals have it like the other arm, back to normal   Currently in Pain? Yes   Pain Score 9   very briefly when moving wrong   Pain Location Elbow   Pain Orientation Left   Pain Descriptors / Indicators Aching;Sharp   Pain Type Acute pain   Pain Radiating Towards shoulder to wrist   Pain Onset 1 to 4 weeks ago   Pain Frequency Intermittent   Aggravating Factors  laying down, sleeping, laying down   Pain Relieving Factors advil and alieve   Effect of Pain on Daily Activities limited household, make-up, fixing hair            [facility] PT Assessment - 01/21/17 0001      Assessment   Medical Diagnosis S52.045 - closed nondisplaced fracture of coronoid process of ulna   Referring Provider Tarry Kos, MD   Onset Date/Surgical Date 01/04/17   Next MD Visit march 20th   Prior Therapy  no     Precautions   Precautions None     Restrictions   Weight Bearing Restrictions Yes   LUE Weight Bearing Non weight bearing   Other Position/Activity Restrictions AAROM, PROM only     Balance Screen   Has the patient fallen in the past 6 months Yes   How many times? 1     Home Nurse, mental health Private residence   Living Arrangements Spouse/significant other;Children   Type of Home House   Home Layout Two level     Prior Function   Level of Independence Independent   Vocation Full time employment   Vocation Requirements typing   Leisure household, cleaning     Cognition   Overall Cognitive Status Within Functional Limits for tasks assessed     Observation/Other Assessments   Focus on Therapeutic Outcomes (FOTO)  50% limited  goal 29% limited     Posture/Postural Control   Posture/Postural Control No significant limitations     AROM   Right Elbow Flexion 30   Right Elbow Extension 0  full ROM   Left Elbow Flexion 70   Left Elbow Extension 47  short of full extension   Right/Left Wrist Left   Left Wrist Extension 90 Degrees   Left Wrist Flexion  65 Degrees     Strength   Right/Left Elbow Right;Left   Right Elbow Flexion 5/5   Right Elbow Extension 5/5   Left Elbow Flexion 2/5   Left Elbow Extension 2/5   Right/Left Wrist Right;Left   Right Wrist Flexion 5/5   Right Wrist Extension 5/5   Left Wrist Flexion 2/5   Left Wrist Extension 2/5     Palpation   Palpation comment tender to palpation on left wrist flexors and extensors, and left biceps, pecs, upper trap tight     Ambulation/Gait   Gait Pattern Within Functional Limits                   [facility] Adult PT Treatment/Exercise - 01/21/17 0001      Elbow Exercises   Other elbow exercises AAROM wrist flexion/extension; elbow flexion/extension; shoulder flexion     Cryotherapy   Number Minutes Cryotherapy 5 Minutes   Cryotherapy Location Forearm;Other (comment)  elbow    Type of Cryotherapy Ice pack     Manual Therapy   Manual Therapy Soft tissue mobilization   Soft tissue mobilization left bicep, pecs, upper trap                PT Education - 01/21/17 1627    Education provided Yes   Education Details AAROM wrist, elbow, shoulder   Person(s) Educated Patient   Methods Explanation;Demonstration;Tactile cues;Verbal cues;Handout   Comprehension Verbalized understanding;Returned demonstration          PT Short Term Goals - 01/21/17 1739      PT SHORT TERM GOAL #1   Title independnet with initial HEP   Time 4   Period Weeks   Status New     PT SHORT TERM GOAL #2   Title increased elbow PROM flexion and extension by 15 degrees for improved functional reaching   Time 4   Period Weeks   Status New           PT Long Term Goals - 01/21/17 1741      PT LONG TERM GOAL #1   Title independent with advanced HEP   Time 8   Period Weeks   Status New     PT LONG TERM GOAL #2   Title FOTO < or = 29% limited   Time 8   Period Weeks   Status New     PT LONG TERM GOAL #3   Title increased elbow flexion and extension to 4+/5 for functional lifting such as carrying groceries.   Time 8   Period Weeks   Status New     PT LONG TERM GOAL #4   Title increased left UE AROM so patient can fix her hair independently   Time 8   Period Weeks   Status New               Plan - 01/21/17 1722    Clinical Impression Statement Patient is seen for low complexity eval due to no comorbidities.  Patient experienced a fall down stairs in her home and fractures Left coronoid process of ulna.  Patient experiences pain of 9/10 when moving it a certain way.  She reports achiness of 7/10 keeping her up at night.  She is wearing sling when out of the house at this time. States she is leaving it off at home.  She lacks 47 degrees of elbow extension and has decreased flexion due to pain.  Patient has wrist and elbow flexion/extension weakness of 2/5  MMT.  She is lacking shoulder ROM and having some pain radiating up into the shoulder and down into the wrist as well.  Patient is currently NWB on left UE.  Patient needs skilled PT for safe progression of exericses to increase ROM and strength as allowed by physician so she will be able to return to maximum functional potential.     Rehab Potential Excellent   Clinical Impairments Affecting Rehab Potential n/a   PT Frequency 2x / week   PT Duration 8 weeks   PT Treatment/Interventions ADLs/Self Care Home Management;Cryotherapy;Electrical Stimulation;Iontophoresis 4mg /ml Dexamethasone;Moist Heat;Ultrasound;Therapeutic activities;Therapeutic exercise;Neuromuscular re-education;Patient/family education;Manual techniques;Taping;Passive range of motion   PT Next Visit Plan progress AAROM, PROM, NWB, modalities and manual as needed   Recommended Other Services none   Consulted and Agree with Plan of Care Patient      Patient will benefit from skilled therapeutic intervention in order to improve the following deficits and impairments:  Decreased activity tolerance, Decreased range of motion, Decreased strength, Impaired UE functional use, Pain, Hypomobility  Visit Diagnosis: Pain in left elbow - Plan: PT plan of care cert/re-cert  Stiffness of left elbow, not elsewhere classified - Plan: PT plan of care cert/re-cert  Muscle weakness (generalized) - Plan: PT plan of care cert/re-cert     Problem List Patient Active Problem List   Diagnosis Date Noted  . Closed nondisplaced fracture of coronoid process of left ulna 01/05/2017  . Left ear pain 07/02/2014  . Pulmonary nodule 06/22/2014  . Pneumonia, organism unspecified(486) 10/13/2008  . ANXIETY 06/28/2008    Vincente PoliJakki Crosser, PT 01/21/2017, 5:50 PM  [facility] [address], [address] [place], [place], [address] Phone: [phone]   Fax:  [phone]  Name: Kara [name] MRN:  [id number] Date of Birth: 08/16/1971

## 2017-01-26 ENCOUNTER — Ambulatory Visit: Payer: 59 | Admitting: Physical Therapy

## 2017-01-28 ENCOUNTER — Encounter: Payer: 59 | Admitting: Physical Therapy

## 2017-01-30 ENCOUNTER — Encounter: Payer: Self-pay | Admitting: Physical Therapy

## 2017-01-30 ENCOUNTER — Ambulatory Visit: Payer: 59 | Admitting: Physical Therapy

## 2017-01-30 DIAGNOSIS — M25622 Stiffness of left elbow, not elsewhere classified: Secondary | ICD-10-CM | POA: Diagnosis not present

## 2017-01-30 DIAGNOSIS — M25522 Pain in left elbow: Secondary | ICD-10-CM | POA: Diagnosis not present

## 2017-01-30 DIAGNOSIS — M6281 Muscle weakness (generalized): Secondary | ICD-10-CM | POA: Diagnosis not present

## 2017-01-30 NOTE — Therapy (Signed)
[facility] [address] [place], [place], [address] Phone: [phone]   Fax:  [phone]  Physical Therapy Treatment  Patient Details  Name: Kara [name] MRN: [id number] Date of Birth: [birth year] Referring Provider: Leandrew Koyanagi, MD  Encounter Date: 01/30/2017      PT End of Session - 01/30/17 0850    Visit Number 2   Date for PT Re-Evaluation 03/18/17   PT Start Time 0843   PT Stop Time 0933   PT Time Calculation (min) 50 min      Past Medical History:  Diagnosis Date  . Allergy   . Blood in stool     History reviewed. No pertinent surgical history.  There were no vitals filed for this visit.      Subjective Assessment - 01/30/17 0849    Subjective Denies pain unless elbow is resting on table or tries to bend.     Pertinent History NWB, AAROM, PROM   Limitations Lifting   Patient Stated Goals have it like the other arm, back to normal   Currently in Pain? No/denies                         [facility] Adult PT Treatment/Exercise - 01/30/17 0001      Electrical Stimulation   Electrical Stimulation Location left elbow   Electrical Stimulation Action IFC   Electrical Stimulation Parameters to tolerance 15 minutes   Electrical Stimulation Goals Pain     Ultrasound   Ultrasound Location left antecubital and medial elbow   Ultrasound Parameters 20% duty, 3.3Hz , 1.3 W/cm   Ultrasound Goals Pain     Manual Therapy   Manual Therapy Soft tissue mobilization;Passive ROM   Soft tissue mobilization left bicep, tricep, wrist extensors, flexors   Passive ROM elbow flexion and extension                  PT Short Term Goals - 01/21/17 1739      PT SHORT TERM GOAL #1   Title independnet with initial HEP   Time 4   Period Weeks   Status New     PT SHORT TERM GOAL #2   Title increased elbow PROM flexion and extension by 15 degrees for improved functional reaching   Time 4    Period Weeks   Status New           PT Long Term Goals - 01/21/17 1741      PT LONG TERM GOAL #1   Title independent with advanced HEP   Time 8   Period Weeks   Status New     PT LONG TERM GOAL #2   Title FOTO < or = 29% limited   Time 8   Period Weeks   Status New     PT LONG TERM GOAL #3   Title increased elbow flexion and extension to 4+/5 for functional lifting such as carrying groceries.   Time 8   Period Weeks   Status New     PT LONG TERM GOAL #4   Title increased left UE AROM so patient can fix her hair independently   Time 8   Period Weeks   Status New               Plan - 01/30/17 1062    Clinical Impression Statement Pt continues to have reduced elbow flexion and extension with pain at end range.  Has  not met goals due to early in treatment, second visit today.  Patient continues to need skilled PT for ROM.   Rehab Potential Excellent   PT Treatment/Interventions ADLs/Self Care Home Management;Cryotherapy;Electrical Stimulation;Iontophoresis 2m/ml Dexamethasone;Moist Heat;Ultrasound;Therapeutic activities;Therapeutic exercise;Neuromuscular re-education;Patient/family education;Manual techniques;Taping;Passive range of motion   PT Next Visit Plan progress AAROM, PROM, NWB, modalities and manual    Consulted and Agree with Plan of Care Patient      Patient will benefit from skilled therapeutic intervention in order to improve the following deficits and impairments:  Decreased activity tolerance, Decreased range of motion, Decreased strength, Impaired UE functional use, Pain, Hypomobility  Visit Diagnosis: Pain in left elbow  Stiffness of left elbow, not elsewhere classified  Muscle weakness (generalized)       G-Codes - 02018/04/110843    Functional Assessment Tool Used (Outpatient Only) --   Functional Limitation --   Mobility: Walking and Moving Around Current Status ((N1833 --   Mobility: Walking and Moving Around Goal Status ((P8251 --      Problem List Patient Active Problem List   Diagnosis Date Noted  . Closed nondisplaced fracture of coronoid process of left ulna 01/05/2017  . Left ear pain 07/02/2014  . Pulmonary nodule 06/22/2014  . Pneumonia, organism unspecified(486) 10/13/2008  . ANXIETY 06/28/2008    JZannie Cove PT 32018-04-11 10:04 AM  [facility] [address] [place] [address]: [phone]  Fax:  [phone] Name: Kara ALTMANNMRN: [id number] of Birth: 3Aug 20, 1972

## 2017-02-02 ENCOUNTER — Encounter: Payer: Self-pay | Admitting: Physical Therapy

## 2017-02-02 ENCOUNTER — Ambulatory Visit: Payer: 59 | Admitting: Physical Therapy

## 2017-02-02 DIAGNOSIS — M25522 Pain in left elbow: Secondary | ICD-10-CM | POA: Diagnosis not present

## 2017-02-02 DIAGNOSIS — M25622 Stiffness of left elbow, not elsewhere classified: Secondary | ICD-10-CM | POA: Diagnosis not present

## 2017-02-02 DIAGNOSIS — M6281 Muscle weakness (generalized): Secondary | ICD-10-CM

## 2017-02-02 NOTE — Therapy (Signed)
[facility] [address] [place], [place], [address] Phone: [phone]   Fax:  [phone]  Physical Therapy Treatment  Patient Details  Name: Kara [name] MRN: [id number] Date of Birth: 1971/01/31 Referring Provider: Tarry Kos, MD  Encounter Date: 02/02/2017      PT End of Session - 02/02/17 1623    Visit Number 3   Date for PT Re-Evaluation 03/18/17   PT Start Time 1611   PT Stop Time 1642   PT Time Calculation (min) 31 min   Activity Tolerance Patient tolerated treatment well   Behavior During Therapy [facility] for tasks assessed/performed      Past Medical History:  Diagnosis Date  . Allergy   . Blood in stool     History reviewed. No pertinent surgical history.  There were no vitals filed for this visit.      Subjective Assessment - 02/02/17 1622    Subjective Pt reports    Pertinent History NWB, AAROM, PROM   Limitations Lifting   Patient Stated Goals have it like the other arm, back to normal   Currently in Pain? No/denies   Pain Score 5   With movement   Pain Location Elbow   Pain Orientation Left   Pain Descriptors / Indicators Aching;Sharp   Pain Type Acute pain   Pain Radiating Towards Shoulder to wrist   Pain Onset 1 to 4 weeks ago   Pain Frequency Intermittent                         [facility] Adult PT Treatment/Exercise - 02/02/17 0001      Elbow Exercises   Forearm Supination Strengthening;Left;20 reps;Seated   Wrist Flexion Strengthening;Left;20 reps   Wrist Extension Strengthening;Left;20 reps   Bar Weights/Barbell (Wrist Extension) 2 lbs     Shoulder Exercises: Standing   Flexion Strengthening;Left;20 reps   ABduction Strengthening;20 reps;Left   Extension Strengthening;Left;20 reps;Theraband   Theraband Level (Shoulder Extension) Level 1 (Yellow)   Retraction Strengthening;Left;20 reps;Theraband   Theraband Level (Shoulder Retraction) Level 1 (Yellow)     Manual Therapy   Manual Therapy Soft tissue mobilization;Passive ROM   Soft tissue mobilization Left elbow and forarm   Passive ROM elbow flexion and extension  verbal cues to relax shoulder and wrist                  PT Short Term Goals - 02/02/17 1624      PT SHORT TERM GOAL #1   Title independnet with initial HEP   Time 4   Period Weeks   Status On-going     PT SHORT TERM GOAL #2   Title increased elbow PROM flexion and extension by 15 degrees for improved functional reaching   Time 4   Period Weeks   Status On-going           PT Long Term Goals - 02/02/17 1625      PT LONG TERM GOAL #1   Title independent with advanced HEP   Time 8   Period Weeks   Status On-going     PT LONG TERM GOAL #2   Title FOTO < or = 29% limited   Time 8   Period Weeks   Status On-going     PT LONG TERM GOAL #3   Title increased elbow flexion and extension to 4+/5 for functional lifting such as carrying groceries.   Time 8   Period  Weeks   Status On-going     PT LONG TERM GOAL #4   Title increased left UE AROM so patient can fix her hair independently   Time 8   Period Weeks   Status On-going               Plan - 02/02/17 1652    Clinical Impression Statement Pt with continued limited ROM but less elbow pain. Able to tolerate all strengthening exercisis well. PROM and AAROM to elbow. Declined other modalities today stateing she didn't feel that they helped. Pt will continue to benefit from skilled therapy for Lt UE strengthening. Pt will go to MD tomorrow.    Rehab Potential Excellent   Clinical Impairments Affecting Rehab Potential n/a   PT Frequency 2x / week   PT Duration 8 weeks   PT Treatment/Interventions ADLs/Self Care Home Management;Cryotherapy;Electrical Stimulation;Iontophoresis 4mg /ml Dexamethasone;Moist Heat;Ultrasound;Therapeutic activities;Therapeutic exercise;Neuromuscular re-education;Patient/family education;Manual techniques;Taping;Passive  range of motion   PT Next Visit Plan progress AAROM, PROM, NWB, modalities and manual    Consulted and Agree with Plan of Care Patient      Patient will benefit from skilled therapeutic intervention in order to improve the following deficits and impairments:  Decreased activity tolerance, Decreased range of motion, Decreased strength, Impaired UE functional use, Pain, Hypomobility  Visit Diagnosis: Pain in left elbow  Stiffness of left elbow, not elsewhere classified  Muscle weakness (generalized)     Problem List Patient Active Problem List   Diagnosis Date Noted  . Closed nondisplaced fracture of coronoid process of left ulna 01/05/2017  . Left ear pain 07/02/2014  . Pulmonary nodule 06/22/2014  . Pneumonia, organism unspecified(486) 10/13/2008  . ANXIETY 06/28/2008    Dessa PhiKatherine Matthews PTA 02/02/2017, 4:59 PM  [facility] [address] [place], [place], [address] Phone: [phone]   Fax:  [phone]  Name: Kara [name] MRN: [id number] Date of Birth: 07/06/1971

## 2017-02-03 ENCOUNTER — Ambulatory Visit (INDEPENDENT_AMBULATORY_CARE_PROVIDER_SITE_OTHER): Payer: 59 | Admitting: Orthopaedic Surgery

## 2017-02-03 ENCOUNTER — Encounter (INDEPENDENT_AMBULATORY_CARE_PROVIDER_SITE_OTHER): Payer: Self-pay | Admitting: Orthopaedic Surgery

## 2017-02-03 ENCOUNTER — Ambulatory Visit (INDEPENDENT_AMBULATORY_CARE_PROVIDER_SITE_OTHER): Payer: Self-pay

## 2017-02-03 DIAGNOSIS — S52045D Nondisplaced fracture of coronoid process of left ulna, subsequent encounter for closed fracture with routine healing: Secondary | ICD-10-CM

## 2017-02-03 NOTE — Progress Notes (Signed)
Kara [name] is 4 weeks status post minimally displaced coronoid fracture. She is doing physical therapy once a week. Her range of motion is progressing. X-ray show stable appearance of the coronoid fracture. The elbow joint is congruent. Her range of motion is 27 to 105. I want her to increase therapy to twice a week for aggressive range of motion. Begin weightbearing as tolerated follow up with me in 6 weeks.  No x-rays needed unless she is having issues.

## 2017-02-09 ENCOUNTER — Encounter: Payer: Self-pay | Admitting: Physical Therapy

## 2017-02-09 ENCOUNTER — Ambulatory Visit: Payer: 59 | Admitting: Physical Therapy

## 2017-02-09 DIAGNOSIS — M6281 Muscle weakness (generalized): Secondary | ICD-10-CM | POA: Diagnosis not present

## 2017-02-09 DIAGNOSIS — M25622 Stiffness of left elbow, not elsewhere classified: Secondary | ICD-10-CM

## 2017-02-09 DIAGNOSIS — M25522 Pain in left elbow: Secondary | ICD-10-CM

## 2017-02-09 NOTE — Therapy (Signed)
[facility] [address], [address] [place], [place], [address] Phone: [phone]   Fax:  [phone]  Physical Therapy Treatment  Patient Details  Name: Kara [name] MRN: [id number] Date of Birth: 1971/07/30 Referring Provider: Tarry Kos, MD  Encounter Date: 02/09/2017      PT End of Session - 02/09/17 1618    Visit Number 4   Date for PT Re-Evaluation 03/18/17   PT Start Time 1610   PT Stop Time 1653   PT Time Calculation (min) 43 min   Activity Tolerance Patient tolerated treatment well   Behavior During Therapy [facility] for tasks assessed/performed      Past Medical History:  Diagnosis Date  . Allergy   . Blood in stool     History reviewed. No pertinent surgical history.  There were no vitals filed for this visit.      Subjective Assessment - 02/09/17 1614    Subjective Pt reports no pain except with certain movements. Pt states sometimes elbow catches and hurts. Pain at end ROM.    Pertinent History NWB, AAROM, PROM   Limitations Lifting   Patient Stated Goals have it like the other arm, back to normal   Currently in Pain? No/denies   Pain Score 5   With movement   Pain Location Elbow   Pain Orientation Left   Pain Descriptors / Indicators Aching;Sharp   Pain Type Acute pain   Pain Radiating Towards Shoulder to wrist   Pain Onset 1 to 4 weeks ago   Pain Frequency Intermittent   Aggravating Factors  Laying down, sleeping   Pain Relieving Factors Advil and Alieve   Effect of Pain on Daily Activities limited household, make-up, fixing hair                         [facility] Adult PT Treatment/Exercise - 02/09/17 0001      Elbow Exercises   Forearm Supination Strengthening;Left;20 reps;Seated  With velcrow board   Wrist Flexion Strengthening;Left;20 reps  #3   Wrist Extension Strengthening;Left;20 reps  #3     Shoulder Exercises: Standing   Flexion Strengthening;Left;20 reps  #1    ABduction Strengthening;20 reps;Left  #1   Extension Strengthening;Left;20 reps;Theraband   Theraband Level (Shoulder Extension) Level 2 (Red)   Other Standing Exercises Scaption 2x10 #1     Ultrasound   Ultrasound Location Lt posterior elbow around lateral epicondyle   Ultrasound Parameters 20% duty cycle. 3.3 Hz, 1.3 w/cm2   Ultrasound Goals Other (Comment)  Scar tissue     Manual Therapy   Manual Therapy Soft tissue mobilization;Passive ROM   Soft tissue mobilization Left elbow and forarm   Passive ROM elbow flexion and extension  Increase tone in biceps                  PT Short Term Goals - 02/09/17 1618      PT SHORT TERM GOAL #1   Title independnet with initial HEP   Time 4   Status On-going     PT SHORT TERM GOAL #2   Title increased elbow PROM flexion and extension by 15 degrees for improved functional reaching   Time 4   Period Weeks   Status On-going           PT Long Term Goals - 02/09/17 1618      PT LONG TERM GOAL #1   Title independent with advanced HEP  Time 8   Period Weeks   Status On-going     PT LONG TERM GOAL #4   Title increased left UE AROM so patient can fix her hair independently   Time 8   Period Weeks   Status On-going               Plan - 02/09/17 1655    Clinical Impression Statement Pt continues to have limited elbow ROM in both flexion and extension due to tightness in Biceps. Pt reports MD saw "a lot of scar tissue in elbow" as well that is limiting ROM. Pt able to tolerate all exercises well. Continues to have shoulder weakness. Pt tends to keep wrist flexed, talked to patient about stretching wrist into extension regularly. Pt will continue to benefit from skilled therapy for UE strength and ROM.    Rehab Potential Excellent   Clinical Impairments Affecting Rehab Potential n/a   PT Frequency 2x / week   PT Duration 8 weeks   PT Treatment/Interventions ADLs/Self Care Home  Management;Cryotherapy;Electrical Stimulation;Iontophoresis 4mg /ml Dexamethasone;Moist Heat;Ultrasound;Therapeutic activities;Therapeutic exercise;Neuromuscular re-education;Patient/family education;Manual techniques;Taping;Passive range of motion   PT Next Visit Plan progress AAROM, PROM, NWB, modalities and manual    Consulted and Agree with Plan of Care Patient      Patient will benefit from skilled therapeutic intervention in order to improve the following deficits and impairments:  Decreased activity tolerance, Decreased range of motion, Decreased strength, Impaired UE functional use, Pain, Hypomobility  Visit Diagnosis: Pain in left elbow  Stiffness of left elbow, not elsewhere classified  Muscle weakness (generalized)     Problem List Patient Active Problem List   Diagnosis Date Noted  . Closed nondisplaced fracture of coronoid process of left ulna 01/05/2017  . Left ear pain 07/02/2014  . Pulmonary nodule 06/22/2014  . Pneumonia, organism unspecified(486) 10/13/2008  . ANXIETY 06/28/2008    Dessa PhiKatherine Delorese Sellin PTA 02/09/2017, 5:03 PM  [facility] [address], [address] [place], [place], [address] Phone: [phone]   Fax:  [phone]  Name: Kara [name] MRN: [id number] Date of Birth: 05/06/1971

## 2017-02-11 ENCOUNTER — Encounter: Payer: Self-pay | Admitting: Physical Therapy

## 2017-02-11 ENCOUNTER — Ambulatory Visit: Payer: 59 | Admitting: Physical Therapy

## 2017-02-11 DIAGNOSIS — M25622 Stiffness of left elbow, not elsewhere classified: Secondary | ICD-10-CM | POA: Diagnosis not present

## 2017-02-11 DIAGNOSIS — M6281 Muscle weakness (generalized): Secondary | ICD-10-CM | POA: Diagnosis not present

## 2017-02-11 DIAGNOSIS — M25522 Pain in left elbow: Secondary | ICD-10-CM | POA: Diagnosis not present

## 2017-02-11 NOTE — Therapy (Signed)
[facility] [address] [place], [place], [address] Phone: [phone]   Fax:  [phone]  Physical Therapy Treatment  Patient Details  Name: Kara [name] MRN: [id number] Date of Birth: 21-Sep-1971 Referring Provider: Tarry Kos, MD  Encounter Date: 02/11/2017      PT End of Session - 02/11/17 1535    Visit Number 5   Date for PT Re-Evaluation 03/18/17   PT Start Time 1532   PT Stop Time 1610   PT Time Calculation (min) 38 min   Activity Tolerance Patient tolerated treatment well   Behavior During Therapy [facility] for tasks assessed/performed      Past Medical History:  Diagnosis Date  . Allergy   . Blood in stool     History reviewed. No pertinent surgical history.  There were no vitals filed for this visit.      Subjective Assessment - 02/11/17 1534    Subjective Pt reports feeling like she can bend elbow a little bit more today. Tightness at end ROM in elbow. No pain.    Pertinent History NWB, AAROM, PROM   Limitations Lifting   Patient Stated Goals have it like the other arm, back to normal   Currently in Pain? No/denies   Pain Score 0-No pain                         [facility] Adult PT Treatment/Exercise - 02/11/17 0001      Elbow Exercises   Forearm Supination Strengthening;Left;20 reps;Seated  With velcrow board   Wrist Flexion Strengthening;Left;20 reps  #3   Wrist Extension Strengthening;Left;20 reps  #3   Bar Weights/Barbell (Wrist Extension) 2 lbs   Other elbow exercises Pronation, Supineation  hold weight at end     Shoulder Exercises: Standing   Flexion Strengthening;Left;20 reps  #1   ABduction Strengthening;20 reps;Left  #1 focus on straight wrist   Extension Strengthening;Left;20 reps;Theraband   Theraband Level (Shoulder Extension) Level 2 (Red)   Row Strengthening;Both;20 reps;Theraband   Theraband Level (Shoulder Row) Level 2 (Red)   Retraction  Strengthening;Left;20 reps;Theraband   Theraband Level (Shoulder Retraction) Level 2 (Red)   Other Standing Exercises Scaption 2x10 #1  focus on straight wrist   Other Standing Exercises [employer] stacking into shelf     Ultrasound   Ultrasound Location Lt posteriror elbow   Ultrasound Parameters 29% duty cycle 3.3 mHz   Ultrasound Goals Other (Comment)  Scar tissue     Manual Therapy   Manual Therapy Soft tissue mobilization;Passive ROM   Soft tissue mobilization Lt biceps, forarm, posteriror elbow   Passive ROM elbow flexion and extension  decrease tone in biceps                  PT Short Term Goals - 02/09/17 1618      PT SHORT TERM GOAL #1   Title independnet with initial HEP   Time 4   Status On-going     PT SHORT TERM GOAL #2   Title increased elbow PROM flexion and extension by 15 degrees for improved functional reaching   Time 4   Period Weeks   Status On-going           PT Long Term Goals - 02/09/17 1618      PT LONG TERM GOAL #1   Title independent with advanced HEP   Time 8   Period Weeks   Status On-going  PT LONG TERM GOAL #4   Title increased left UE AROM so patient can fix her hair independently   Time 8   Period Weeks   Status On-going               Plan - 02/11/17 1703    Clinical Impression Statement Pt continues to have limitations with elbow ROM. Doing well with shoulder strength. Needing verbal cues to keep wrist in neutral, patient tends to keep wrist flexed. Pt continues to have increased tone in biceps which is contributing to limited extension. Pt will continue to benefit from skilled therapy for shoulder ROM and UE strength.    Rehab Potential Excellent   Clinical Impairments Affecting Rehab Potential n/a   PT Frequency 2x / week   PT Duration 8 weeks   PT Treatment/Interventions ADLs/Self Care Home Management;Cryotherapy;Electrical Stimulation;Iontophoresis 4mg /ml Dexamethasone;Moist Heat;Ultrasound;Therapeutic  activities;Therapeutic exercise;Neuromuscular re-education;Patient/family education;Manual techniques;Taping;Passive range of motion   PT Next Visit Plan progress AAROM, PROM, NWB, modalities and manual    Consulted and Agree with Plan of Care Patient      Patient will benefit from skilled therapeutic intervention in order to improve the following deficits and impairments:  Decreased activity tolerance, Decreased range of motion, Decreased strength, Impaired UE functional use, Pain, Hypomobility  Visit Diagnosis: Pain in left elbow  Stiffness of left elbow, not elsewhere classified  Muscle weakness (generalized)     Problem List Patient Active Problem List   Diagnosis Date Noted  . Closed nondisplaced fracture of coronoid process of left ulna 01/05/2017  . Left ear pain 07/02/2014  . Pulmonary nodule 06/22/2014  . Pneumonia, organism unspecified(486) 10/13/2008  . ANXIETY 06/28/2008    Dessa PhiKatherine Russel Morain PTA 02/11/2017, 5:07 PM  [facility] [address], [address] [place], [place], [address] Phone: [phone]   Fax:  [phone]  Name: Kara [name] MRN: [id number] Date of Birth: 10/29/1971

## 2017-02-12 DIAGNOSIS — B351 Tinea unguium: Secondary | ICD-10-CM | POA: Diagnosis not present

## 2017-02-12 DIAGNOSIS — M25775 Osteophyte, left foot: Secondary | ICD-10-CM | POA: Diagnosis not present

## 2017-02-20 ENCOUNTER — Encounter: Payer: Self-pay | Admitting: Physical Therapy

## 2017-02-20 ENCOUNTER — Ambulatory Visit: Payer: 59 | Attending: Orthopaedic Surgery | Admitting: Physical Therapy

## 2017-02-20 DIAGNOSIS — M6281 Muscle weakness (generalized): Secondary | ICD-10-CM | POA: Insufficient documentation

## 2017-02-20 DIAGNOSIS — M25622 Stiffness of left elbow, not elsewhere classified: Secondary | ICD-10-CM | POA: Diagnosis not present

## 2017-02-20 DIAGNOSIS — M25522 Pain in left elbow: Secondary | ICD-10-CM | POA: Insufficient documentation

## 2017-02-20 NOTE — Therapy (Signed)
[facility] [address] [place], [place], [address] Phone: [phone]   Fax:  [phone]  Physical Therapy Treatment  Patient Details  Name: Kara [name] MRN: [id number] Date of Birth: Apr 06, 1971 Referring Provider: Tarry Kos, MD  Encounter Date: 02/20/2017      PT End of Session - 02/20/17 1018    Visit Number 6   Date for PT Re-Evaluation 03/18/17   PT Start Time 1015   PT Stop Time 1056   PT Time Calculation (min) 41 min   Activity Tolerance Patient tolerated treatment well   Behavior During Therapy [facility] for tasks assessed/performed      Past Medical History:  Diagnosis Date  . Allergy   . Blood in stool     History reviewed. No pertinent surgical history.  There were no vitals filed for this visit.      Subjective Assessment - 02/20/17 1018    Subjective Pt reports some elbow aching just with movement. Otherwise it's fine, no pain.    Pertinent History NWB, AAROM, PROM   Limitations Lifting   Patient Stated Goals have it like the other arm, back to normal   Currently in Pain? No/denies   Pain Score 0-No pain            [facility] PT Assessment - 02/20/17 0001      AROM   Left Elbow Flexion 110   Left Elbow Extension 10                     [facility] Adult PT Treatment/Exercise - 02/20/17 0001      Therapeutic Activites    Therapeutic Activities ADL's   ADL's Pushing, pulling, reaching     Elbow Exercises   Forearm Supination Strengthening;Left;20 reps;Seated  With velcrow board   Wrist Flexion --  Discontinue due to focus on increasing wrist extension   Wrist Extension Strengthening;Left;20 reps  #3   Bar Weights/Barbell (Wrist Extension) 2 lbs   Other elbow exercises Pronation, Supineation  hold weight at end     Shoulder Exercises: Standing   Flexion Strengthening;Left;20 reps  #2; scaption 2x10 #2   ABduction Strengthening;20 reps;Left  #2 VC for straight  wrist   Extension Strengthening;Left;20 reps;Theraband   Theraband Level (Shoulder Extension) Level 2 (Red)   Row Strengthening;Both;20 reps;Theraband   Theraband Level (Shoulder Row) Level 2 (Red)   Retraction Strengthening;Left;20 reps;Theraband   Theraband Level (Shoulder Retraction) Level 2 (Red)   Other Standing Exercises wall circles x30 seconds   Other Standing Exercises Chest press  #2 plates with focus on elbow extension     Ultrasound   Ultrasound Location Lt posterior elbow   Ultrasound Parameters 20% Duty cycle 3.3 mHz   Ultrasound Goals Other (Comment)  scar tissue     Manual Therapy   Manual Therapy --   Soft tissue mobilization --   Passive ROM --                PT Education - 02/20/17 1203    Education provided Yes   Education Details Extension stretching   Person(s) Educated Patient   Methods Explanation;Demonstration;Handout   Comprehension Verbalized understanding          PT Short Term Goals - 02/20/17 1154      PT SHORT TERM GOAL #1   Title independnet with initial HEP   Status Achieved     PT SHORT TERM GOAL #2   Title increased elbow PROM  flexion and extension by 15 degrees for improved functional reaching   Baseline 10   Status Achieved           PT Long Term Goals - 02/20/17 1155      PT LONG TERM GOAL #1   Title independent with advanced HEP   Time 8   Period Weeks   Status On-going               Plan - 02/20/17 1149    Clinical Impression Statement Pt continues to be limited with elbow ROM and UE strength. Pt has improved with extension to -10. Verbal cues to keep wrist extended and educated on biceps stretching. Able to tolerate all strengthening exercises well. Will continue to progress elbow flexion and shoulder strength.    Rehab Potential Excellent   Clinical Impairments Affecting Rehab Potential n/a   PT Frequency 2x / week   PT Duration 8 weeks   PT Treatment/Interventions ADLs/Self Care Home  Management;Cryotherapy;Electrical Stimulation;Iontophoresis /ml Dexamethasone;Moist Heat;Ultrasound;Therapeutic activities;Therapeutic exercise;Neuromuscular re-education;Patient/family education;Manual techniques;Taping;Passive range of motion   PT Next Visit Plan Elbow flexion   Consulted and Agree with Plan of Care Patient      Patient will benefit from skilled therapeutic intervention in order to improve the following deficits and impairments:  Decreased activity tolerance, Decreased range of motion, Decreased strength, Impaired UE functional use, Pain, Hypomobility  Visit Diagnosis: Pain in left elbow  Stiffness of left elbow, not elsewhere classified  Muscle weakness (generalized)     Problem List Patient Active Problem List   Diagnosis Date Noted  . Closed nondisplaced fracture of coronoid process of left ulna 01/05/2017  . Left ear pain 07/02/2014  . Pulmonary nodule 06/22/2014  . Pneumonia, organism unspecified(486) 10/13/2008  . ANXIETY 06/28/2008    Dessa Phi PTA 02/20/2017, 12:06 PM  [facility] [address] [place], [place], [address] Phone: [phone]   Fax:  [phone]  Name: Kara [name] MRN: [id number] Date of Birth: [birth year]

## 2017-02-20 NOTE — Patient Instructions (Signed)
CARPAL TUNNEL (Nerve Compression Syndrome): Wrist Stretch    Extend right arm with fingers facing down. With left hand, gently pull fingers of right hand toward body. Hold position for _10__ seconds. Repeat with arms switched. Repeat _2__ times, alternating arms. Do _1__ times per day.  Copyright  VHI. All rights reserved.   ELBOW: Biceps - Supine    Lie with folded towel or pillow above elbow, arm slightly away from body, palm u and allow wrist to extend. Hold __15_ seconds. _2__ reps per set, _1__ sets per day, _5__ days per week   Copyright  VHI. All rights reserved.   Kara [name], PTA 02/20/17 10:37 AM  [facility] [address] [place], [place] [address] Phone # [phone] Fax [phone]

## 2017-02-23 ENCOUNTER — Ambulatory Visit: Payer: 59 | Admitting: Physical Therapy

## 2017-02-23 DIAGNOSIS — M25522 Pain in left elbow: Secondary | ICD-10-CM

## 2017-02-23 DIAGNOSIS — M6281 Muscle weakness (generalized): Secondary | ICD-10-CM

## 2017-02-23 DIAGNOSIS — M25622 Stiffness of left elbow, not elsewhere classified: Secondary | ICD-10-CM

## 2017-02-23 NOTE — Therapy (Signed)
[facility] [address], [address] [place], [place], [address] Phone: [phone]   Fax:  [phone]  Physical Therapy Treatment  Patient Details  Name: SAMEEN LEAS MRN: [id number] Date of Birth: 03-20-1971 Referring Provider: Tarry Kos, MD  Encounter Date: 02/23/2017      PT End of Session - 02/23/17 1713    Visit Number 7   Date for PT Re-Evaluation 03/18/17   PT Start Time 1614   PT Stop Time 1654   PT Time Calculation (min) 40 min   Activity Tolerance Patient tolerated treatment well   Behavior During Therapy [facility] for tasks assessed/performed      Past Medical History:  Diagnosis Date  . Allergy   . Blood in stool     No past surgical history on file.  There were no vitals filed for this visit.      Subjective Assessment - 02/23/17 1717    Subjective Pt reports just some pain when moving arm.  Denies pain currently.   Limitations Lifting   Patient Stated Goals have it like the other arm, back to normal   Currently in Pain? No/denies                         [facility] Adult PT Treatment/Exercise - 02/23/17 0001      Therapeutic Activites    Therapeutic Activities ADL's   ADL's Pushing, pulling, reaching     Elbow Exercises   Wrist Extension Strengthening;Left;20 reps  #3   Bar Weights/Barbell (Wrist Extension) 3 lbs   Other elbow exercises Pronation, Supineation  hold weight at end 3# 20x     Shoulder Exercises: Standing   Flexion Strengthening;Left;20 reps  #2; scaption 2x10 #2   ABduction Strengthening;20 reps;Left  #2 VC for straight wrist   Extension Strengthening;Left;20 reps;Theraband   Row Strengthening;Both;20 reps;Theraband   Other Standing Exercises bicep curls 3#   Other Standing Exercises D1/D2 extension - yellow band 20x each     Manual Therapy   Manual Therapy Joint mobilization;Soft tissue mobilization;Passive ROM   Joint Mobilization radial rotational mobs   Soft tissue mobilization Lt biceps, forarm, posteriror elbow   Passive ROM elbow flexion and extension  decrease tone in biceps                  PT Short Term Goals - 02/20/17 1154      PT SHORT TERM GOAL #1   Title independnet with initial HEP   Status Achieved     PT SHORT TERM GOAL #2   Title increased elbow PROM flexion and extension by 15 degrees for improved functional reaching   Baseline 10   Status Achieved           PT Long Term Goals - 02/23/17 1714      PT LONG TERM GOAL #1   Title independent with advanced HEP   Time 8   Period Weeks   Status On-going     PT LONG TERM GOAL #2   Title FOTO < or = 29% limited   Time 8   Period Weeks   Status On-going     PT LONG TERM GOAL #3   Title increased elbow flexion and extension to 4+/5 for functional lifting such as carrying groceries.   Time 8   Period Weeks   Status On-going     PT LONG TERM GOAL #4   Title increased left UE AROM so patient  can fix her hair independently   Time 8   Period Weeks   Status On-going               Plan - 02/23/17 1714    Clinical Impression Statement Pt achieved 135 deg of elbow flexion at the end of treatment today.  Pt educated on working on ROM as much as possible and for as long as tolerated in order to lengthen scar tissue.  Pt continues to nees strength and ROM for return to functional tasks.   Rehab Potential Excellent   PT Treatment/Interventions ADLs/Self Care Home Management;Cryotherapy;Electrical Stimulation;Iontophoresis /ml Dexamethasone;Moist Heat;Ultrasound;Therapeutic activities;Therapeutic exercise;Neuromuscular re-education;Patient/family education;Manual techniques;Taping;Passive range of motion   PT Next Visit Plan Elbow flexion, UE strength    Consulted and Agree with Plan of Care Patient      Patient will benefit from skilled therapeutic intervention in order to improve the following deficits and impairments:  Decreased activity  tolerance, Decreased range of motion, Decreased strength, Impaired UE functional use, Pain, Hypomobility  Visit Diagnosis: Pain in left elbow  Muscle weakness (generalized)  Stiffness of left elbow, not elsewhere classified     Problem List Patient Active Problem List   Diagnosis Date Noted  . Closed nondisplaced fracture of coronoid process of left ulna 01/05/2017  . Left ear pain 07/02/2014  . Pulmonary nodule 06/22/2014  . Pneumonia, organism unspecified(486) 10/13/2008  . ANXIETY 06/28/2008    Vincente Poli, PT 02/23/2017, 5:18 PM  [facility] [address], [address] [place], [place], [address] Phone: [phone]   Fax:  [phone]  Name: CYTHIA BACHTEL MRN: [id number] Date of Birth: [birth year]

## 2017-02-26 ENCOUNTER — Encounter: Payer: Self-pay | Admitting: Physical Therapy

## 2017-02-26 ENCOUNTER — Ambulatory Visit: Payer: 59 | Admitting: Physical Therapy

## 2017-02-26 DIAGNOSIS — M6281 Muscle weakness (generalized): Secondary | ICD-10-CM | POA: Diagnosis not present

## 2017-02-26 DIAGNOSIS — M25622 Stiffness of left elbow, not elsewhere classified: Secondary | ICD-10-CM | POA: Diagnosis not present

## 2017-02-26 DIAGNOSIS — M25522 Pain in left elbow: Secondary | ICD-10-CM | POA: Diagnosis not present

## 2017-02-26 NOTE — Therapy (Signed)
[facility] [address] [place], [place], [address] Phone: [phone]   Fax:  [phone]  Physical Therapy Treatment  Patient Details  Name: Kara [name] MRN: [id number] Date of Birth: 1971-06-19 Referring Provider: Tarry Kos, MD  Encounter Date: 02/26/2017      PT End of Session - 02/26/17 1620    Visit Number 8   Date for PT Re-Evaluation 03/18/17   PT Start Time 1617   PT Stop Time 1700   PT Time Calculation (min) 43 min   Activity Tolerance Patient tolerated treatment well   Behavior During Therapy [facility] for tasks assessed/performed      Past Medical History:  Diagnosis Date  . Allergy   . Blood in stool     History reviewed. No pertinent surgical history.  There were no vitals filed for this visit.      Subjective Assessment - 02/26/17 1619    Subjective Just hurts when moving it a certain way.  It's alright.  States motion feels maybe a little bit better.   Limitations Lifting   Patient Stated Goals have it like the other arm, back to normal   Currently in Pain? No/denies                         [facility] Adult PT Treatment/Exercise - 02/26/17 0001      Shoulder Exercises: Standing   Flexion Strengthening;Left;20 reps  #2; scaption 2x10 #2   ABduction Strengthening;20 reps;Left  #2 VC for straight wrist   Extension Strengthening;Left;20 reps;Weights  15#   Row Strengthening;Both;20 reps;Weights  15#   Other Standing Exercises bicep curls 3#   Other Standing Exercises lat pull down 15# 20x     Shoulder Exercises: ROM/Strengthening   UBE (Upper Arm Bike) L3 3x3     Manual Therapy   Manual Therapy Joint mobilization;Soft tissue mobilization;Passive ROM   Joint Mobilization radial rotational mobs   Soft tissue mobilization Lt biceps, forarm, posteriror elbow   Passive ROM elbow flexion and extension  decrease tone in biceps                  PT Short  Term Goals - 02/20/17 1154      PT SHORT TERM GOAL #1   Title independnet with initial HEP   Status Achieved     PT SHORT TERM GOAL #2   Title increased elbow PROM flexion and extension by 15 degrees for improved functional reaching   Baseline 10   Status Achieved           PT Long Term Goals - 02/23/17 1714      PT LONG TERM GOAL #1   Title independent with advanced HEP   Time 8   Period Weeks   Status On-going     PT LONG TERM GOAL #2   Title FOTO < or = 29% limited   Time 8   Period Weeks   Status On-going     PT LONG TERM GOAL #3   Title increased elbow flexion and extension to 4+/5 for functional lifting such as carrying groceries.   Time 8   Period Weeks   Status On-going     PT LONG TERM GOAL #4   Title increased left UE AROM so patient can fix her hair independently   Time 8   Period Weeks   Status On-going  Plan - 02/26/17 1621    Clinical Impression Statement Pt continues to demonstrate 135 degress AROM elbow flexion and still lacking about 5 degrees of extension.  Pt is able to increase resistance of exercises and will benefit from skilled PT for progressing ROM and strength for functional activities such as grooming.   Rehab Potential Excellent   PT Treatment/Interventions ADLs/Self Care Home Management;Cryotherapy;Electrical Stimulation;Iontophoresis /ml Dexamethasone;Moist Heat;Ultrasound;Therapeutic activities;Therapeutic exercise;Neuromuscular re-education;Patient/family education;Manual techniques;Taping;Passive range of motion   PT Next Visit Plan Elbow flexion, UE strength    Consulted and Agree with Plan of Care Patient      Patient will benefit from skilled therapeutic intervention in order to improve the following deficits and impairments:  Decreased activity tolerance, Decreased range of motion, Decreased strength, Impaired UE functional use, Pain, Hypomobility  Visit Diagnosis: Pain in left elbow  Muscle weakness  (generalized)  Stiffness of left elbow, not elsewhere classified     Problem List Patient Active Problem List   Diagnosis Date Noted  . Closed nondisplaced fracture of coronoid process of left ulna 01/05/2017  . Left ear pain 07/02/2014  . Pulmonary nodule 06/22/2014  . Pneumonia, organism unspecified(486) 10/13/2008  . ANXIETY 06/28/2008    Vincente Poli, PT  02/26/2017, 4:58 PM  [facility] [address] [place], [place], [address] Phone: [phone]   Fax:  [phone]  Name: Kara [name] MRN: [id number] Date of Birth: Jan 24, 1971

## 2017-03-02 ENCOUNTER — Ambulatory Visit: Payer: 59 | Admitting: Physical Therapy

## 2017-03-02 DIAGNOSIS — M25522 Pain in left elbow: Secondary | ICD-10-CM

## 2017-03-02 DIAGNOSIS — M6281 Muscle weakness (generalized): Secondary | ICD-10-CM

## 2017-03-02 DIAGNOSIS — M25622 Stiffness of left elbow, not elsewhere classified: Secondary | ICD-10-CM | POA: Diagnosis not present

## 2017-03-02 NOTE — Therapy (Signed)
[facility] [address] [place], [place], [address] Phone: [phone]   Fax:  [phone]  Physical Therapy Treatment  Patient Details  Name: Kara [name] MRN: [id number] Date of Birth: May 06, 1971 Referring Provider: Tarry Kos, MD  Encounter Date: 03/02/2017      PT End of Session - 03/02/17 1623    Visit Number 9   Date for PT Re-Evaluation 03/18/17   PT Start Time 1620   PT Stop Time 1658   PT Time Calculation (min) 38 min   Activity Tolerance Patient tolerated treatment well   Behavior During Therapy [facility] for tasks assessed/performed      Past Medical History:  Diagnosis Date  . Allergy   . Blood in stool     No past surgical history on file.  There were no vitals filed for this visit.      Subjective Assessment - 03/02/17 1623    Subjective Elbow is about the same   Limitations Lifting   Patient Stated Goals have it like the other arm, back to normal   Currently in Pain? No/denies                         [facility] Adult PT Treatment/Exercise - 03/02/17 0001      Elbow Exercises   Wrist Extension Strengthening;Left;20 reps  #3   Bar Weights/Barbell (Wrist Extension) 3 lbs   Other elbow exercises Pronation, Supineation  hold weight at end 3# 20x     Shoulder Exercises: Standing   Flexion Strengthening;Left;20 reps  #2; scaption 2x10 #2   ABduction Strengthening;20 reps;Left  #2 VC for straight wrist   Extension Strengthening;Left;20 reps;Weights  15#   Row Strengthening;Both;20 reps;Weights  20#   Other Standing Exercises bicep curls 3#   Other Standing Exercises lat pull down 15# 20x     Shoulder Exercises: ROM/Strengthening   UBE (Upper Arm Bike) L3 3x3     Manual Therapy   Manual Therapy Joint mobilization;Soft tissue mobilization;Passive ROM   Joint Mobilization radial rotational mobs   Soft tissue mobilization Lt biceps, forarm, posteriror elbow   Passive  ROM elbow flexion and extension  decrease tone in biceps                  PT Short Term Goals - 02/20/17 1154      PT SHORT TERM GOAL #1   Title independnet with initial HEP   Status Achieved     PT SHORT TERM GOAL #2   Title increased elbow PROM flexion and extension by 15 degrees for improved functional reaching   Baseline 10   Status Achieved           PT Long Term Goals - 03/02/17 1627      PT LONG TERM GOAL #1   Title independent with advanced HEP   Time 8   Period Weeks   Status On-going     PT LONG TERM GOAL #2   Title FOTO < or = 29% limited   Time 8   Period Weeks   Status On-going     PT LONG TERM GOAL #4   Title increased left UE AROM so patient can fix her hair independently   Time 8   Period Weeks   Status Achieved               Plan - 03/02/17 1647    Clinical Impression Statement Pt continues to demonstrate  increased ROM, AROM 138 degrees and PROM 43 degrees on left elbow.  Right elbow flexion is 160 degrees.  Pt was educated in self massage to scar tissue on medial antecubital region for increased ROM and decreased scar tissue   Rehab Potential Excellent   PT Treatment/Interventions ADLs/Self Care Home Management;Cryotherapy;Electrical Stimulation;Iontophoresis /ml Dexamethasone;Moist Heat;Ultrasound;Therapeutic activities;Therapeutic exercise;Neuromuscular re-education;Patient/family education;Manual techniques;Taping;Passive range of motion   PT Next Visit Plan Elbow flexion/ext, UE strength    Consulted and Agree with Plan of Care Patient      Patient will benefit from skilled therapeutic intervention in order to improve the following deficits and impairments:  Decreased activity tolerance, Decreased range of motion, Decreased strength, Impaired UE functional use, Pain, Hypomobility  Visit Diagnosis: Pain in left elbow  Muscle weakness (generalized)  Stiffness of left elbow, not elsewhere classified     Problem  List Patient Active Problem List   Diagnosis Date Noted  . Closed nondisplaced fracture of coronoid process of left ulna 01/05/2017  . Left ear pain 07/02/2014  . Pulmonary nodule 06/22/2014  . Pneumonia, organism unspecified(486) 10/13/2008  . ANXIETY 06/28/2008    Vincente Poli, PT 03/02/2017, 4:55 PM  [facility] [address] [place], [place], [address] Phone: [phone]   Fax:  [phone]  Name: Kara [name] MRN: [id number] Date of Birth: [birth year]

## 2017-03-05 ENCOUNTER — Encounter: Payer: Self-pay | Admitting: Physical Therapy

## 2017-03-05 ENCOUNTER — Ambulatory Visit: Payer: 59 | Admitting: Physical Therapy

## 2017-03-05 DIAGNOSIS — M25522 Pain in left elbow: Secondary | ICD-10-CM

## 2017-03-05 DIAGNOSIS — M6281 Muscle weakness (generalized): Secondary | ICD-10-CM

## 2017-03-05 DIAGNOSIS — M25622 Stiffness of left elbow, not elsewhere classified: Secondary | ICD-10-CM

## 2017-03-05 NOTE — Therapy (Signed)
[facility] [address], [address] [place], [place], [address] Phone: [phone]   Fax:  [phone]  Physical Therapy Treatment  Patient Details  Name: Kara [name] MRN: [id number] Date of Birth: [birth year] Referring Provider: Tarry Kos, MD  Encounter Date: 03/05/2017      PT End of Session - 03/05/17 1624    Visit Number 10   Date for PT Re-Evaluation 03/18/17   PT Start Time 1620   PT Stop Time 1645   PT Time Calculation (min) 25 min   Activity Tolerance Patient tolerated treatment well   Behavior During Therapy [facility] for tasks assessed/performed      Past Medical History:  Diagnosis Date  . Allergy   . Blood in stool     History reviewed. No pertinent surgical history.  There were no vitals filed for this visit.      Subjective Assessment - 03/05/17 1623    Subjective About the same.   Limitations Lifting   Patient Stated Goals have it like the other arm, back to normal   Currently in Pain? No/denies            [facility] PT Assessment - 03/05/17 0001      AROM   Left Elbow Flexion 141  PROM 150 deg                     [facility] Adult PT Treatment/Exercise - 03/05/17 0001      Therapeutic Activites    Therapeutic Activities ADL's   ADL's Pushing, pulling, reaching     Elbow Exercises   Wrist Extension Strengthening;Left;20 reps  #3   Bar Weights/Barbell (Wrist Extension) 3 lbs   Other elbow exercises Pronation, Supineation  hold weight at end 3# 20x     Shoulder Exercises: Standing   Flexion Strengthening;Left;20 reps  #2; scaption 2x10 #2   ABduction Strengthening;20 reps;Left  #2 VC for straight wrist   Extension Strengthening;Left;20 reps;Weights  15#   Row Strengthening;Both;20 reps;Weights  20#   Other Standing Exercises bicep curls 3#   Other Standing Exercises lat pull down 15# 20x     Shoulder Exercises: ROM/Strengthening   UBE (Upper Arm Bike) L3 3x3     Manual Therapy   Manual Therapy Joint mobilization;Soft tissue mobilization;Passive ROM   Joint Mobilization radial rotational mobs   Soft tissue mobilization Lt biceps, forarm, posteriror elbow   Passive ROM elbow flexion and extension  decrease tone in biceps                  PT Short Term Goals - 02/20/17 1154      PT SHORT TERM GOAL #1   Title independnet with initial HEP   Status Achieved     PT SHORT TERM GOAL #2   Title increased elbow PROM flexion and extension by 15 degrees for improved functional reaching   Baseline 10   Status Achieved           PT Long Term Goals - 03/02/17 1627      PT LONG TERM GOAL #1   Title independent with advanced HEP   Time 8   Period Weeks   Status On-going     PT LONG TERM GOAL #2   Title FOTO < or = 29% limited   Time 8   Period Weeks   Status On-going     PT LONG TERM GOAL #4   Title increased left UE AROM so patient can  fix her hair independently   Time 8   Period Weeks   Status Achieved               Plan - 03/05/17 1651    Clinical Impression Statement Pt demonstrates improved ROM with AROM right elbow flexion 141 deg and PROM 150 deg.  Patient only having minor issues with self care activities and is doing well with HEP. Pt will benefit from one more visit to ensure safe discharge with HEP in order for her to continue to progress at home.   Rehab Potential Excellent   PT Treatment/Interventions ADLs/Self Care Home Management;Cryotherapy;Electrical Stimulation;Iontophoresis /ml Dexamethasone;Moist Heat;Ultrasound;Therapeutic activities;Therapeutic exercise;Neuromuscular re-education;Patient/family education;Manual techniques;Taping;Passive range of motion   PT Next Visit Plan discuss discharge if no changes   Consulted and Agree with Plan of Care Patient      Patient will benefit from skilled therapeutic intervention in order to improve the following deficits and impairments:  Decreased activity  tolerance, Decreased range of motion, Decreased strength, Impaired UE functional use, Pain, Hypomobility  Visit Diagnosis: Pain in left elbow  Muscle weakness (generalized)  Stiffness of left elbow, not elsewhere classified     Problem List Patient Active Problem List   Diagnosis Date Noted  . Closed nondisplaced fracture of coronoid process of left ulna 01/05/2017  . Left ear pain 07/02/2014  . Pulmonary nodule 06/22/2014  . Pneumonia, organism unspecified(486) 10/13/2008  . ANXIETY 06/28/2008    Vincente Poli, PT 03/05/2017, 4:54 PM  [facility] [address] [place], [place], [address] Phone: [phone]   Fax:  [phone]  Name: Kara [name] MRN: [id number] Date of Birth: 09-Sep-1971

## 2017-03-09 ENCOUNTER — Encounter: Payer: Self-pay | Admitting: Physical Therapy

## 2017-03-09 ENCOUNTER — Ambulatory Visit: Payer: 59 | Admitting: Physical Therapy

## 2017-03-09 DIAGNOSIS — M6281 Muscle weakness (generalized): Secondary | ICD-10-CM | POA: Diagnosis not present

## 2017-03-09 DIAGNOSIS — M25622 Stiffness of left elbow, not elsewhere classified: Secondary | ICD-10-CM | POA: Diagnosis not present

## 2017-03-09 DIAGNOSIS — M25522 Pain in left elbow: Secondary | ICD-10-CM | POA: Diagnosis not present

## 2017-03-09 NOTE — Therapy (Addendum)
[facility] [address], [place], [place], [address] Phone: [phone]   Fax:  [phone]  Physical Therapy Treatment  Patient Details  Name: Kara [name] MRN: [id number] Date of Birth: 1971/08/24 Referring Provider: Leandrew Koyanagi, MD  Encounter Date: 03/09/2017      PT End of Session - 03/09/17 1620    Visit Number 11   Date for PT Re-Evaluation 03/18/17   PT Start Time 1616   PT Stop Time 1637  Patient ready to leave and be done with therapy   PT Time Calculation (min) 21 min   Activity Tolerance Patient tolerated treatment well   Behavior During Therapy [facility] for tasks assessed/performed      Past Medical History:  Diagnosis Date  . Allergy   . Blood in stool     History reviewed. No pertinent surgical history.  There were no vitals filed for this visit.      Subjective Assessment - 03/09/17 1619    Subjective No pain unless I try to bend it all the way.    Pertinent History --   Limitations --   Patient Stated Goals --   Currently in Pain? No/denies   Pain Score 0-No pain            [facility] PT Assessment - 03/09/17 0001      Assessment   Medical Diagnosis S52.045 - closed nondisplaced fracture of coronoid process of ulna   Onset Date/Surgical Date 01/04/17   Next MD Visit march 20th   Prior Therapy no     Precautions   Precautions None     Restrictions   Weight Bearing Restrictions Yes   LUE Weight Bearing Non weight bearing   Other Position/Activity Restrictions AAROM, PROM only     Balance Screen   Has the patient fallen in the past 6 months Yes   How many times? [place] residence   Living Arrangements Spouse/significant other;Children   Type of [place] Two level     Prior Function   Level of Independence Independent   Vocation Full time employment   Vocation Requirements typing   Leisure household, cleaning      Cognition   Overall Cognitive Status Within Functional Limits for tasks assessed     Observation/Other Assessments   Focus on Therapeutic Outcomes (FOTO)  29% limited     Posture/Postural Control   Posture/Postural Control No significant limitations     AROM   Left Elbow Flexion 141  PROM 150 deg     Strength   Left Elbow Flexion 4+/5   Left Elbow Extension 4+/5   Left Wrist Flexion 4+/5   Left Wrist Extension 4+/5     Palpation   Palpation comment tender to palpation on left wrist flexors and extensors, and left biceps, pecs, upper trap tight     Ambulation/Gait   Gait Pattern Within Functional Limits                     [facility] Adult PT Treatment/Exercise - 03/09/17 0001      Therapeutic Activites    Therapeutic Activities ADL's   ADL's Pushing, pulling, reaching     Elbow Exercises   Other elbow exercises Pronation, Supineation  hold weight at end 3# 20x     Shoulder Exercises: Standing   Flexion Strengthening;Left;20 reps  #2; scaption 2x10 #2   ABduction  Strengthening;20 reps;Left  #2 VC for straight wrist   Extension Strengthening;Left;20 reps;Weights  15#   Row Strengthening;Both;20 reps;Weights  20#   Other Standing Exercises bicep curls 4#   Other Standing Exercises Triceps Extension  Green band     Shoulder Exercises: ROM/Strengthening   UBE (Upper Arm Bike) L3 3x3                  PT Short Term Goals - 03/09/17 1620      PT SHORT TERM GOAL #1   Title independnet with initial HEP   Status Achieved     PT SHORT TERM GOAL #2   Title increased elbow PROM flexion and extension by 15 degrees for improved functional reaching   Status Achieved           PT Long Term Goals - 03/09/17 1620      PT LONG TERM GOAL #1   Title independent with advanced HEP   Status Achieved     PT LONG TERM GOAL #2   Title FOTO < or = 29% limited   Baseline 29%   Status Achieved     PT LONG TERM GOAL #3   Title increased elbow  flexion and extension to 4+/5 for functional lifting such as carrying groceries.   Status Achieved     PT LONG TERM GOAL #4   Title increased left UE AROM so patient can fix her hair independently   Status Achieved               Plan - 03/09/17 1636    Clinical Impression Statement Pt ready to progress to home exercises only. Pt has met al short term goals and long term goals. Pt reports she has no difficultly with ADLs and FOTO indicated 29% limitations. Pt reports she is confident with all home exercises and is ready to be done with therapy.   Clinical Impairments Affecting Rehab Potential --      Patient will benefit from skilled therapeutic intervention in order to improve the following deficits and impairments:     Visit Diagnosis: Pain in left elbow  Muscle weakness (generalized)  Stiffness of left elbow, not elsewhere classified     Problem List Patient Active Problem List   Diagnosis Date Noted  . Closed nondisplaced fracture of coronoid process of left ulna 01/05/2017  . Left ear pain 07/02/2014  . Pulmonary nodule 06/22/2014  . Pneumonia, organism unspecified(486) 10/13/2008  . ANXIETY 06/28/2008    Mikle Bosworth, PTA 03/09/17 4:48 PM   [facility] [address] [place], [place], [address] Phone: [phone]   Fax:  [phone]  Name: Kara [name] MRN: [id number] Date of Birth: 03-22-1971  PHYSICAL THERAPY DISCHARGE SUMMARY  Visits from Start of Care: 11  Current functional level related to goals / functional outcomes: See above   Remaining deficits: See above   Education / Equipment: HEP Plan: Patient agrees to discharge.  Patient goals were met. Patient is being discharged due to meeting the stated rehab goals.  ?????         [employer], PT 03/09/17 4:50 PM

## 2017-03-12 ENCOUNTER — Encounter: Payer: 59 | Admitting: Physical Therapy

## 2017-03-12 DIAGNOSIS — B351 Tinea unguium: Secondary | ICD-10-CM | POA: Diagnosis not present

## 2017-03-17 ENCOUNTER — Ambulatory Visit (INDEPENDENT_AMBULATORY_CARE_PROVIDER_SITE_OTHER): Payer: 59 | Admitting: Orthopaedic Surgery

## 2017-03-17 ENCOUNTER — Encounter (INDEPENDENT_AMBULATORY_CARE_PROVIDER_SITE_OTHER): Payer: Self-pay | Admitting: Orthopaedic Surgery

## 2017-03-17 DIAGNOSIS — S52045D Nondisplaced fracture of coronoid process of left ulna, subsequent encounter for closed fracture with routine healing: Secondary | ICD-10-CM

## 2017-03-17 NOTE — Progress Notes (Signed)
Kara [name] is 10 weeks status post left coronoid fracture. She finished physical therapy last week. She mainly complaining of some soreness with extending her elbow. On exam her elbow range of motion is 14-140. She has full pronation and supination. Overall she is very functional with her arm. At this point I think she has essentially reached MMI. Release to full activity. Follow-up with me as needed. She understands that she will likely not achieve full extension as a result of this injury.

## 2017-03-19 ENCOUNTER — Encounter: Payer: 59 | Admitting: Physical Therapy

## 2017-04-15 DIAGNOSIS — B351 Tinea unguium: Secondary | ICD-10-CM | POA: Diagnosis not present

## 2017-05-13 DIAGNOSIS — B351 Tinea unguium: Secondary | ICD-10-CM | POA: Diagnosis not present

## 2017-08-12 DIAGNOSIS — B351 Tinea unguium: Secondary | ICD-10-CM | POA: Diagnosis not present

## 2017-09-16 DIAGNOSIS — Z01419 Encounter for gynecological examination (general) (routine) without abnormal findings: Secondary | ICD-10-CM | POA: Diagnosis not present

## 2017-09-16 DIAGNOSIS — Z124 Encounter for screening for malignant neoplasm of cervix: Secondary | ICD-10-CM | POA: Diagnosis not present

## 2017-09-16 DIAGNOSIS — Z1231 Encounter for screening mammogram for malignant neoplasm of breast: Secondary | ICD-10-CM | POA: Diagnosis not present

## 2017-09-18 DIAGNOSIS — H5213 Myopia, bilateral: Secondary | ICD-10-CM | POA: Diagnosis not present

## 2017-09-18 DIAGNOSIS — H52223 Regular astigmatism, bilateral: Secondary | ICD-10-CM | POA: Diagnosis not present

## 2017-10-09 DIAGNOSIS — J209 Acute bronchitis, unspecified: Secondary | ICD-10-CM | POA: Diagnosis not present

## 2017-11-04 DIAGNOSIS — L600 Ingrowing nail: Secondary | ICD-10-CM | POA: Diagnosis not present

## 2017-11-04 DIAGNOSIS — B351 Tinea unguium: Secondary | ICD-10-CM | POA: Diagnosis not present

## 2017-11-11 DIAGNOSIS — B351 Tinea unguium: Secondary | ICD-10-CM | POA: Diagnosis not present

## 2017-11-12 DIAGNOSIS — B351 Tinea unguium: Secondary | ICD-10-CM | POA: Diagnosis not present

## 2017-12-01 DIAGNOSIS — Z23 Encounter for immunization: Secondary | ICD-10-CM | POA: Diagnosis not present

## 2017-12-01 DIAGNOSIS — L700 Acne vulgaris: Secondary | ICD-10-CM | POA: Diagnosis not present

## 2017-12-11 DIAGNOSIS — B351 Tinea unguium: Secondary | ICD-10-CM | POA: Diagnosis not present

## 2018-01-08 DIAGNOSIS — B351 Tinea unguium: Secondary | ICD-10-CM | POA: Diagnosis not present

## 2018-01-14 DIAGNOSIS — M25775 Osteophyte, left foot: Secondary | ICD-10-CM | POA: Diagnosis not present

## 2018-01-15 DIAGNOSIS — Z01812 Encounter for preprocedural laboratory examination: Secondary | ICD-10-CM | POA: Diagnosis not present

## 2018-01-27 DIAGNOSIS — M25775 Osteophyte, left foot: Secondary | ICD-10-CM | POA: Diagnosis not present

## 2018-02-03 DIAGNOSIS — B351 Tinea unguium: Secondary | ICD-10-CM | POA: Diagnosis not present

## 2018-02-03 DIAGNOSIS — M25775 Osteophyte, left foot: Secondary | ICD-10-CM | POA: Diagnosis not present

## 2018-02-10 DIAGNOSIS — M25775 Osteophyte, left foot: Secondary | ICD-10-CM | POA: Diagnosis not present

## 2018-02-24 DIAGNOSIS — M25775 Osteophyte, left foot: Secondary | ICD-10-CM | POA: Diagnosis not present

## 2018-05-31 DIAGNOSIS — Z411 Encounter for cosmetic surgery: Secondary | ICD-10-CM | POA: Diagnosis not present

## 2018-05-31 DIAGNOSIS — L821 Other seborrheic keratosis: Secondary | ICD-10-CM | POA: Diagnosis not present

## 2018-05-31 DIAGNOSIS — L820 Inflamed seborrheic keratosis: Secondary | ICD-10-CM | POA: Diagnosis not present

## 2018-08-25 DIAGNOSIS — H5213 Myopia, bilateral: Secondary | ICD-10-CM | POA: Diagnosis not present

## 2018-08-25 DIAGNOSIS — H52223 Regular astigmatism, bilateral: Secondary | ICD-10-CM | POA: Diagnosis not present

## 2018-12-29 DIAGNOSIS — Z124 Encounter for screening for malignant neoplasm of cervix: Secondary | ICD-10-CM | POA: Diagnosis not present

## 2018-12-29 DIAGNOSIS — Z01419 Encounter for gynecological examination (general) (routine) without abnormal findings: Secondary | ICD-10-CM | POA: Diagnosis not present

## 2018-12-29 DIAGNOSIS — Z1231 Encounter for screening mammogram for malignant neoplasm of breast: Secondary | ICD-10-CM | POA: Diagnosis not present

## 2018-12-29 DIAGNOSIS — R350 Frequency of micturition: Secondary | ICD-10-CM | POA: Diagnosis not present

## 2018-12-30 LAB — HM MAMMOGRAPHY

## 2019-04-22 DIAGNOSIS — L600 Ingrowing nail: Secondary | ICD-10-CM | POA: Diagnosis not present

## 2019-04-22 DIAGNOSIS — M25775 Osteophyte, left foot: Secondary | ICD-10-CM | POA: Diagnosis not present

## 2019-05-09 DIAGNOSIS — N926 Irregular menstruation, unspecified: Secondary | ICD-10-CM | POA: Diagnosis not present

## 2019-06-13 MED FILL — MEDROXYPROGESTERONE 10 MG T: 10 | 30 days supply | Qty: 30 | Fill #0

## 2019-07-28 DIAGNOSIS — L700 Acne vulgaris: Secondary | ICD-10-CM | POA: Diagnosis not present

## 2019-09-23 DIAGNOSIS — L739 Follicular disorder, unspecified: Secondary | ICD-10-CM | POA: Diagnosis not present

## 2019-10-21 DIAGNOSIS — H9011 Conductive hearing loss, unilateral, right ear, with unrestricted hearing on the contralateral side: Secondary | ICD-10-CM | POA: Diagnosis not present

## 2019-10-21 DIAGNOSIS — Z9889 Other specified postprocedural states: Secondary | ICD-10-CM | POA: Diagnosis not present

## 2019-11-23 DIAGNOSIS — J069 Acute upper respiratory infection, unspecified: Secondary | ICD-10-CM | POA: Diagnosis not present

## 2019-11-24 DIAGNOSIS — J069 Acute upper respiratory infection, unspecified: Secondary | ICD-10-CM | POA: Diagnosis not present

## 2019-12-19 MED FILL — MEDROXYPROGESTERONE 10 MG T: 10 | 30 days supply | Qty: 30 | Fill #1

## 2020-02-29 DIAGNOSIS — H52223 Regular astigmatism, bilateral: Secondary | ICD-10-CM | POA: Diagnosis not present

## 2020-02-29 DIAGNOSIS — H5213 Myopia, bilateral: Secondary | ICD-10-CM | POA: Diagnosis not present

## 2020-03-19 DIAGNOSIS — B070 Plantar wart: Secondary | ICD-10-CM | POA: Diagnosis not present

## 2020-03-19 DIAGNOSIS — M25775 Osteophyte, left foot: Secondary | ICD-10-CM | POA: Diagnosis not present

## 2020-04-03 DIAGNOSIS — B070 Plantar wart: Secondary | ICD-10-CM | POA: Diagnosis not present

## 2020-04-10 DIAGNOSIS — B070 Plantar wart: Secondary | ICD-10-CM | POA: Diagnosis not present

## 2020-05-03 DIAGNOSIS — Z1231 Encounter for screening mammogram for malignant neoplasm of breast: Secondary | ICD-10-CM | POA: Diagnosis not present

## 2020-05-03 DIAGNOSIS — Z01419 Encounter for gynecological examination (general) (routine) without abnormal findings: Secondary | ICD-10-CM | POA: Diagnosis not present

## 2020-05-09 DIAGNOSIS — B070 Plantar wart: Secondary | ICD-10-CM | POA: Diagnosis not present

## 2020-07-28 DIAGNOSIS — J029 Acute pharyngitis, unspecified: Secondary | ICD-10-CM | POA: Diagnosis not present

## 2020-07-28 DIAGNOSIS — J0140 Acute pansinusitis, unspecified: Secondary | ICD-10-CM | POA: Diagnosis not present

## 2020-12-31 DIAGNOSIS — L700 Acne vulgaris: Secondary | ICD-10-CM | POA: Diagnosis not present

## 2021-05-14 DIAGNOSIS — Z1231 Encounter for screening mammogram for malignant neoplasm of breast: Secondary | ICD-10-CM | POA: Diagnosis not present

## 2021-05-14 DIAGNOSIS — Z01419 Encounter for gynecological examination (general) (routine) without abnormal findings: Secondary | ICD-10-CM | POA: Diagnosis not present

## 2021-05-21 ENCOUNTER — Other Ambulatory Visit: Payer: Self-pay | Admitting: Obstetrics and Gynecology

## 2021-05-21 DIAGNOSIS — R928 Other abnormal and inconclusive findings on diagnostic imaging of breast: Secondary | ICD-10-CM

## 2021-05-30 DIAGNOSIS — Z1211 Encounter for screening for malignant neoplasm of colon: Secondary | ICD-10-CM | POA: Diagnosis not present

## 2021-06-04 ENCOUNTER — Other Ambulatory Visit: Payer: Self-pay

## 2021-06-04 ENCOUNTER — Other Ambulatory Visit: Payer: Self-pay | Admitting: Obstetrics and Gynecology

## 2021-06-04 ENCOUNTER — Ambulatory Visit
Admission: RE | Admit: 2021-06-04 | Discharge: 2021-06-04 | Disposition: A | Discharge status: Home / Self Care | Payer: 59 | Source: Ambulatory Visit | Attending: Obstetrics and Gynecology | Admitting: Obstetrics and Gynecology

## 2021-06-04 DIAGNOSIS — R928 Other abnormal and inconclusive findings on diagnostic imaging of breast: Secondary | ICD-10-CM

## 2021-06-04 DIAGNOSIS — N6002 Solitary cyst of left breast: Secondary | ICD-10-CM | POA: Diagnosis not present

## 2021-06-12 ENCOUNTER — Ambulatory Visit
Admission: RE | Admit: 2021-06-12 | Discharge: 2021-06-12 | Disposition: A | Discharge status: Home / Self Care | Payer: 59 | Source: Ambulatory Visit | Attending: Obstetrics and Gynecology | Admitting: Obstetrics and Gynecology

## 2021-06-12 ENCOUNTER — Other Ambulatory Visit: Payer: Self-pay

## 2021-06-12 DIAGNOSIS — R928 Other abnormal and inconclusive findings on diagnostic imaging of breast: Secondary | ICD-10-CM

## 2021-06-12 DIAGNOSIS — N6323 Unspecified lump in the left breast, lower outer quadrant: Secondary | ICD-10-CM | POA: Diagnosis not present

## 2021-06-12 DIAGNOSIS — N6012 Diffuse cystic mastopathy of left breast: Secondary | ICD-10-CM | POA: Diagnosis not present

## 2021-07-19 DIAGNOSIS — Z1211 Encounter for screening for malignant neoplasm of colon: Secondary | ICD-10-CM | POA: Diagnosis not present

## 2021-08-28 DIAGNOSIS — H52223 Regular astigmatism, bilateral: Secondary | ICD-10-CM | POA: Diagnosis not present

## 2021-08-28 DIAGNOSIS — H5213 Myopia, bilateral: Secondary | ICD-10-CM | POA: Diagnosis not present

## 2022-02-18 DIAGNOSIS — H6983 Other specified disorders of Eustachian tube, bilateral: Secondary | ICD-10-CM | POA: Diagnosis not present

## 2022-05-22 DIAGNOSIS — L700 Acne vulgaris: Secondary | ICD-10-CM | POA: Diagnosis not present

## 2022-06-24 DIAGNOSIS — Z1231 Encounter for screening mammogram for malignant neoplasm of breast: Secondary | ICD-10-CM | POA: Diagnosis not present

## 2022-06-24 DIAGNOSIS — Z124 Encounter for screening for malignant neoplasm of cervix: Secondary | ICD-10-CM | POA: Diagnosis not present

## 2022-06-24 DIAGNOSIS — Z01419 Encounter for gynecological examination (general) (routine) without abnormal findings: Secondary | ICD-10-CM | POA: Diagnosis not present

## 2022-07-24 DIAGNOSIS — Z1211 Encounter for screening for malignant neoplasm of colon: Secondary | ICD-10-CM | POA: Diagnosis not present

## 2022-08-27 DIAGNOSIS — H5213 Myopia, bilateral: Secondary | ICD-10-CM | POA: Diagnosis not present

## 2022-08-27 DIAGNOSIS — H524 Presbyopia: Secondary | ICD-10-CM | POA: Diagnosis not present

## 2022-08-27 DIAGNOSIS — H52223 Regular astigmatism, bilateral: Secondary | ICD-10-CM | POA: Diagnosis not present

## 2022-09-26 DIAGNOSIS — Z1211 Encounter for screening for malignant neoplasm of colon: Secondary | ICD-10-CM | POA: Diagnosis not present

## 2022-12-11 DIAGNOSIS — B078 Other viral warts: Secondary | ICD-10-CM | POA: Diagnosis not present

## 2023-04-21 IMAGING — MG MM BREAST LOCALIZATION CLIP
4 series · 4 of 12 positions shown · non-contrast
Comparison: Previous exam(s).

CLINICAL DATA: Status post ultrasound-guided biopsy for LEFT breast
mass at the 4 o'clock axis.

EXAM:
3D DIAGNOSTIC LEFT MAMMOGRAM POST ULTRASOUND BIOPSY

[L CC synth-2D]
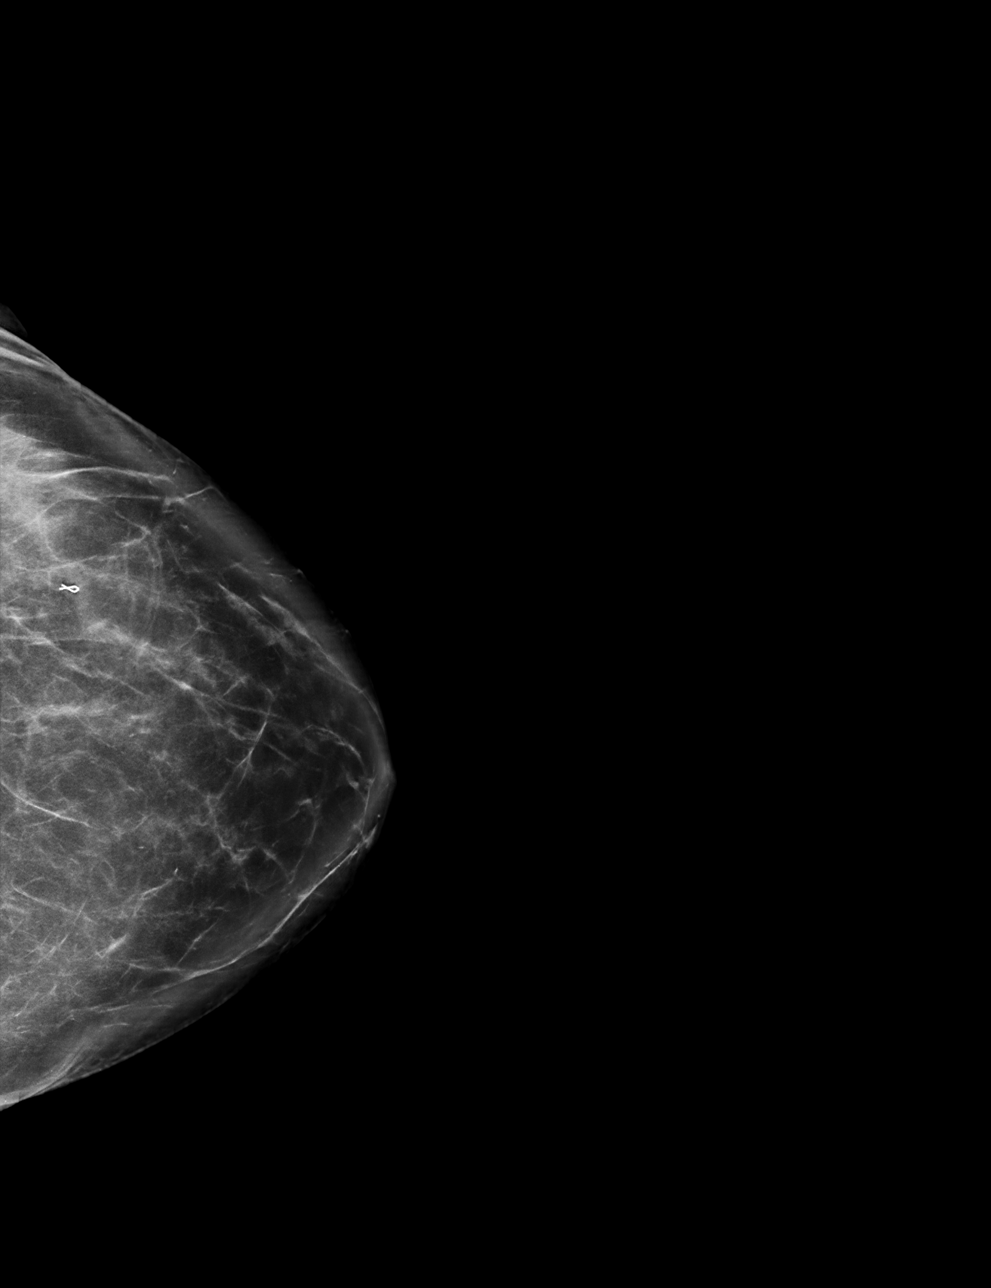

[L ML synth-2D]
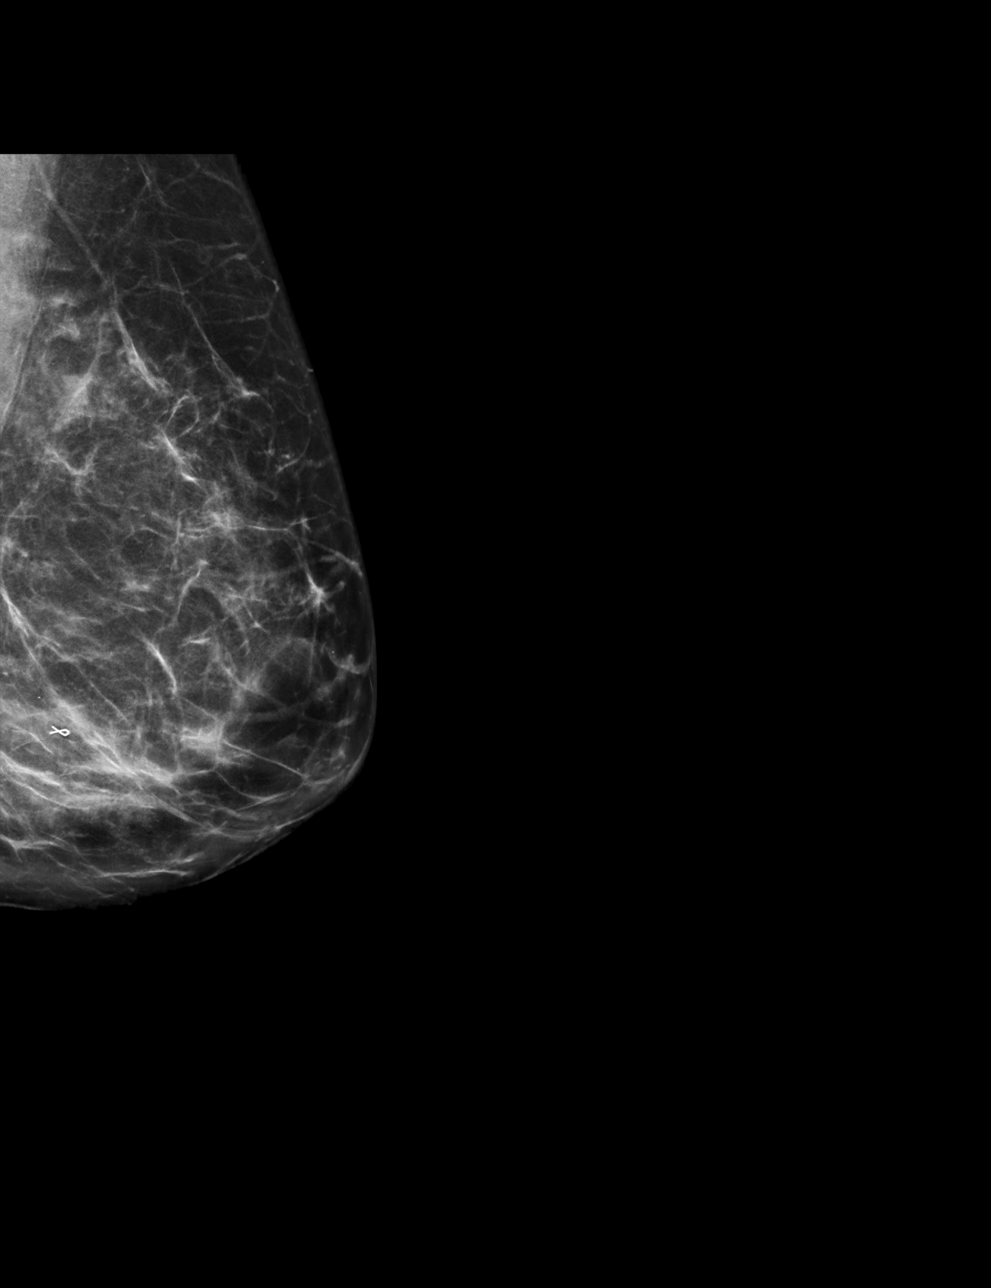

[L CC tomo · tomo slice 39/77.0]
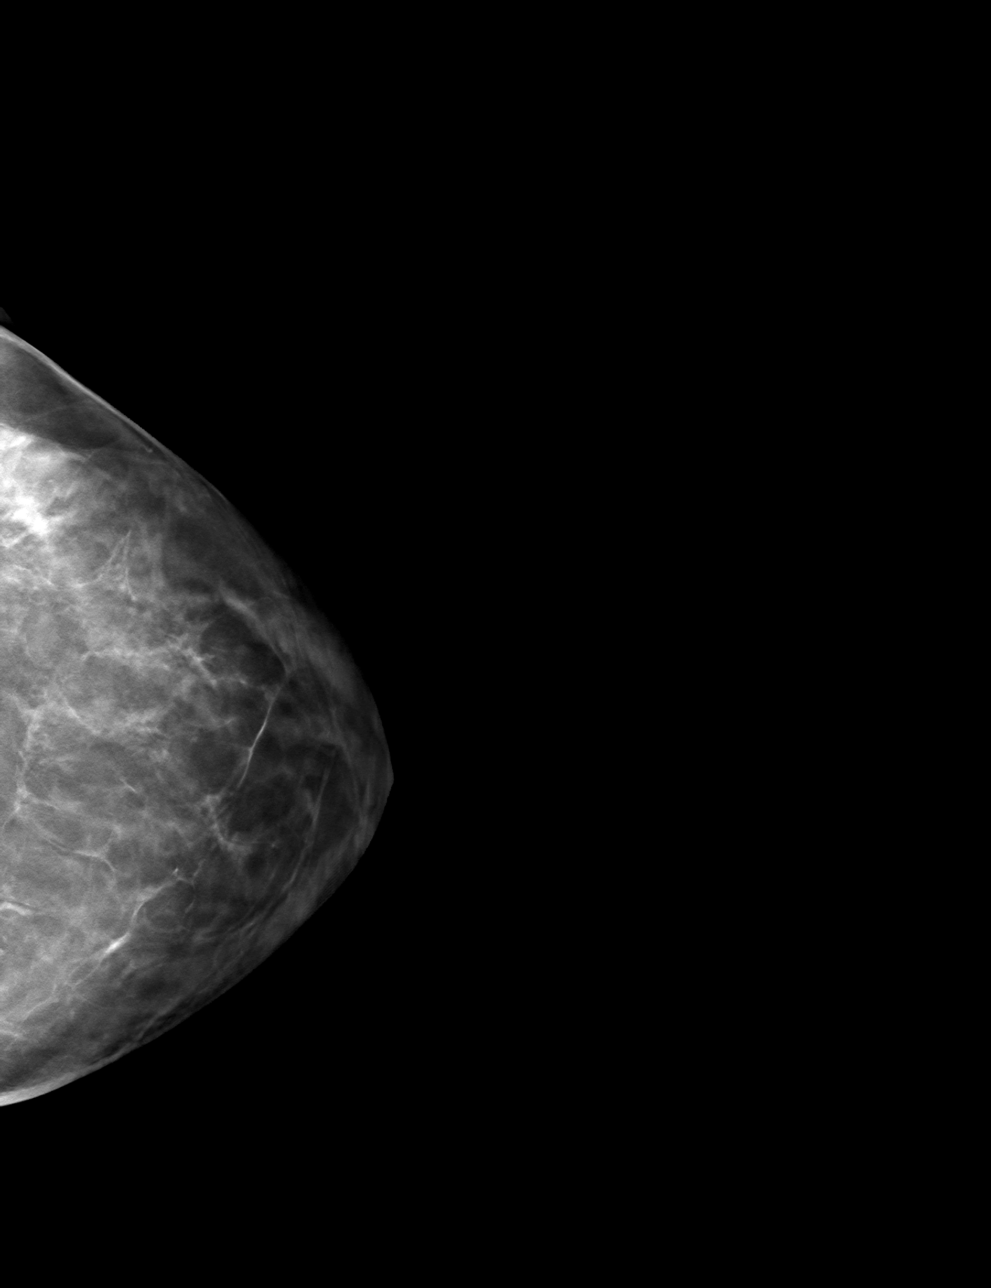

[L ML tomo · tomo slice 33/65.0]
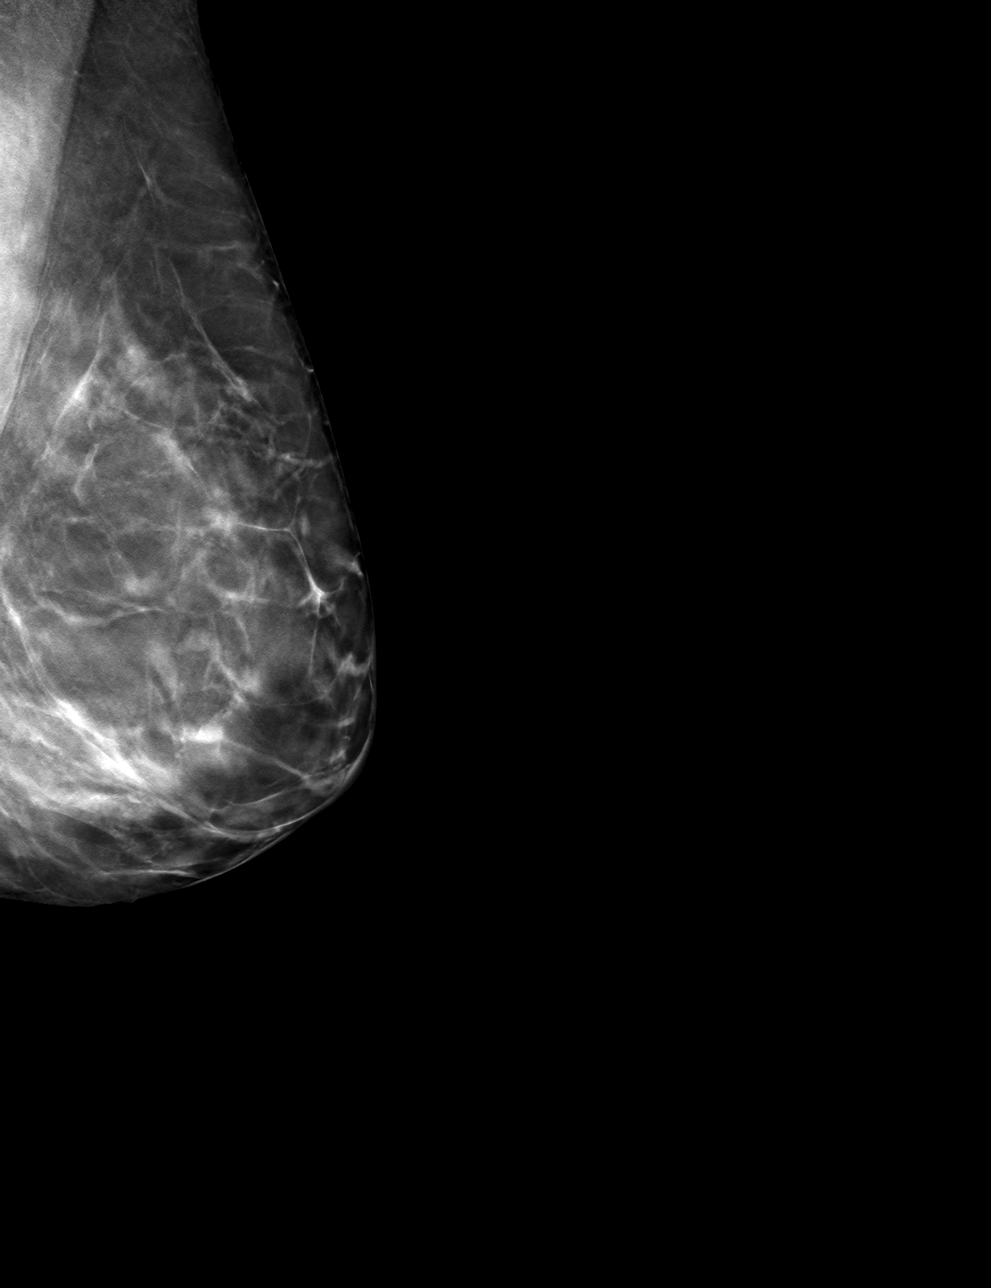

[4 of 12 positions shown; findings below may reference images not displayed]

FINDINGS: 3D Mammographic images were obtained following ultrasound guided
biopsy of the LEFT breast mass at the 4 o'clock axis. The biopsy
marking clip is in expected position at the site of biopsy.
IMPRESSION: Appropriate positioning of the ribbon shaped biopsy marking clip at
the site of biopsy in the lower outer quadrant corresponding to the
targeted mass at the 4 o'clock axis.

Final Assessment: Post Procedure Mammograms for Marker Placement

## 2023-04-29 ENCOUNTER — Other Ambulatory Visit (HOSPITAL_BASED_OUTPATIENT_CLINIC_OR_DEPARTMENT_OTHER): Payer: Self-pay

## 2023-04-29 MED ORDER — AMOXICILLIN 500 MG PO CAPS
500.0000 mg | ORAL_CAPSULE | Freq: Three times a day (TID) | ORAL | 0 refills | Status: AC
  Filled 2023-04-29: qty 21, 7d supply, fill #0

## 2023-07-06 DIAGNOSIS — Z1231 Encounter for screening mammogram for malignant neoplasm of breast: Secondary | ICD-10-CM | POA: Diagnosis not present

## 2023-07-06 DIAGNOSIS — Z01419 Encounter for gynecological examination (general) (routine) without abnormal findings: Secondary | ICD-10-CM | POA: Diagnosis not present

## 2023-08-28 ENCOUNTER — Other Ambulatory Visit (HOSPITAL_BASED_OUTPATIENT_CLINIC_OR_DEPARTMENT_OTHER): Payer: Self-pay

## 2023-09-02 DIAGNOSIS — H524 Presbyopia: Secondary | ICD-10-CM | POA: Diagnosis not present

## 2023-10-09 DIAGNOSIS — N950 Postmenopausal bleeding: Secondary | ICD-10-CM | POA: Diagnosis not present

## 2023-12-14 DIAGNOSIS — N950 Postmenopausal bleeding: Secondary | ICD-10-CM | POA: Diagnosis not present

## 2023-12-17 ENCOUNTER — Other Ambulatory Visit (HOSPITAL_BASED_OUTPATIENT_CLINIC_OR_DEPARTMENT_OTHER): Payer: Self-pay

## 2023-12-17 DIAGNOSIS — L700 Acne vulgaris: Secondary | ICD-10-CM | POA: Diagnosis not present

## 2023-12-17 MED ORDER — TRETINOIN 0.05 % EX CREA
TOPICAL_CREAM | CUTANEOUS | 5 refills | Status: AC
  Filled 2023-12-17: qty 45, 30d supply, fill #0
  Filled 2024-09-16: qty 45, 30d supply, fill #1

## 2024-08-23 DIAGNOSIS — Z01419 Encounter for gynecological examination (general) (routine) without abnormal findings: Secondary | ICD-10-CM | POA: Diagnosis not present

## 2024-08-23 DIAGNOSIS — Z1231 Encounter for screening mammogram for malignant neoplasm of breast: Secondary | ICD-10-CM | POA: Diagnosis not present

## 2024-09-07 DIAGNOSIS — H5213 Myopia, bilateral: Secondary | ICD-10-CM | POA: Diagnosis not present
